# Patient Record
Sex: Male | Born: 1980
Health system: Southern US, Community
[De-identification: ages and names within clinical notes are randomized; demographics above are authoritative.]

## PROBLEM LIST (undated history)

## (undated) DIAGNOSIS — F419 Anxiety disorder, unspecified: Secondary | ICD-10-CM

## (undated) DIAGNOSIS — N35919 Unspecified urethral stricture, male, unspecified site: Secondary | ICD-10-CM

## (undated) DIAGNOSIS — J45909 Unspecified asthma, uncomplicated: Secondary | ICD-10-CM

## (undated) HISTORY — PX: URETHRAL DILATION: SUR417

---

## 2012-06-13 ENCOUNTER — Ambulatory Visit: Payer: Self-pay | Admitting: Family

## 2015-10-15 ENCOUNTER — Encounter: Payer: Self-pay | Admitting: Emergency Medicine

## 2015-10-15 ENCOUNTER — Observation Stay
Admission: EM | Admit: 2015-10-15 | Discharge: 2015-10-17 | Payer: No Typology Code available for payment source | Attending: Internal Medicine | Admitting: Internal Medicine

## 2015-10-15 DIAGNOSIS — F10929 Alcohol use, unspecified with intoxication, unspecified: Secondary | ICD-10-CM

## 2015-10-15 DIAGNOSIS — Z881 Allergy status to other antibiotic agents status: Secondary | ICD-10-CM | POA: Insufficient documentation

## 2015-10-15 DIAGNOSIS — R45851 Suicidal ideations: Secondary | ICD-10-CM | POA: Insufficient documentation

## 2015-10-15 DIAGNOSIS — F10129 Alcohol abuse with intoxication, unspecified: Secondary | ICD-10-CM | POA: Insufficient documentation

## 2015-10-15 DIAGNOSIS — F1910 Other psychoactive substance abuse, uncomplicated: Secondary | ICD-10-CM

## 2015-10-15 DIAGNOSIS — R4182 Altered mental status, unspecified: Secondary | ICD-10-CM | POA: Insufficient documentation

## 2015-10-15 DIAGNOSIS — G5600 Carpal tunnel syndrome, unspecified upper limb: Secondary | ICD-10-CM | POA: Insufficient documentation

## 2015-10-15 DIAGNOSIS — F329 Major depressive disorder, single episode, unspecified: Secondary | ICD-10-CM | POA: Insufficient documentation

## 2015-10-15 DIAGNOSIS — T510X2A Toxic effect of ethanol, intentional self-harm, initial encounter: Secondary | ICD-10-CM | POA: Insufficient documentation

## 2015-10-15 DIAGNOSIS — T50902A Poisoning by unspecified drugs, medicaments and biological substances, intentional self-harm, initial encounter: Secondary | ICD-10-CM

## 2015-10-15 DIAGNOSIS — T391X2A Poisoning by 4-Aminophenol derivatives, intentional self-harm, initial encounter: Principal | ICD-10-CM | POA: Insufficient documentation

## 2015-10-15 DIAGNOSIS — F17210 Nicotine dependence, cigarettes, uncomplicated: Secondary | ICD-10-CM | POA: Insufficient documentation

## 2015-10-15 DIAGNOSIS — IMO0002 Reserved for concepts with insufficient information to code with codable children: Secondary | ICD-10-CM

## 2015-10-15 LAB — SALICYLATE LEVEL

## 2015-10-15 LAB — COMPREHENSIVE METABOLIC PANEL
ALK PHOS: 60 U/L (ref 38–126)
ALT: 24 U/L (ref 17–63)
AST: 31 U/L (ref 15–41)
Albumin: 4.5 g/dL (ref 3.5–5.0)
Anion gap: 12 (ref 5–15)
BUN: 11 mg/dL (ref 6–20)
CALCIUM: 9 mg/dL (ref 8.9–10.3)
CO2: 24 mmol/L (ref 22–32)
CREATININE: 0.95 mg/dL (ref 0.61–1.24)
Chloride: 104 mmol/L (ref 101–111)
GFR calc non Af Amer: >60 mL/min (ref >60)
Glucose, Bld: 125 mg/dL — ABNORMAL HIGH (ref 65–99)
Potassium: 4.1 mmol/L (ref 3.5–5.1)
SODIUM: 140 mmol/L (ref 135–145)
Total Bilirubin: 0.9 mg/dL (ref 0.3–1.2)
Total Protein: 7.6 g/dL (ref 6.5–8.1)

## 2015-10-15 LAB — CBC
HEMATOCRIT: 42.7 % (ref 40.0–52.0)
HEMOGLOBIN: 15 g/dL (ref 13.0–18.0)
MCH: 36.1 pg — AB (ref 26.0–34.0)
MCHC: 35 g/dL (ref 32.0–36.0)
MCV: 103 fL — ABNORMAL HIGH (ref 80.0–100.0)
Platelets: 286 10*3/uL (ref 150–440)
RBC: 4.15 MIL/uL — ABNORMAL LOW (ref 4.40–5.90)
RDW: 12.9 % (ref 11.5–14.5)
WBC: 10.4 10*3/uL (ref 3.8–10.6)

## 2015-10-15 LAB — ETHANOL: Alcohol, Ethyl (B): 232 mg/dL — ABNORMAL HIGH (ref <5)

## 2015-10-15 LAB — ACETAMINOPHEN LEVEL: Acetaminophen (Tylenol), Serum: 69 ug/mL — ABNORMAL HIGH (ref 10–30)

## 2015-10-15 NOTE — ED Notes (Signed)
BEHAVIORAL HEALTH ROUNDING Patient sleeping: Yes.   Patient alert and oriented: yes Behavior appropriate: Yes.  ;  Nutrition and fluids offered: Yes  Toileting and hygiene offered: Yes  Sitter present: yes Law enforcement present: Yes   ENVIRONMENTAL ASSESSMENT Potentially harmful objects out of patient reach: Yes.   Personal belongings secured: Yes.   Patient dressed in hospital provided attire only: Yes.   Plastic bags out of patient reach: Yes.   Patient care equipment (cords, cables, call bells, lines, and drains) shortened, removed, or accounted for: Yes.   Equipment and supplies removed from bottom of stretcher: Yes.   Potentially toxic materials out of patient reach: Yes.   Sharps container removed or out of patient reach: Yes.     

## 2015-10-15 NOTE — ED Provider Notes (Signed)
[facility] Emergency Department Provider Note  ____________________________________________  Time seen: 2110  I have reviewed the triage vital signs and the nursing notes.  Limited history as the patient appears intoxicated and is not very communicative and cooperative.  HISTORY  Chief Complaint Suicidal     HPI Charles Meyers is a 34 y.o. male who is been brought to the emergency department under an involuntary commitment due to his erratic behavior and threats of self-harm. I am told he has drank quite a bit of NyQuil, alcohol, and has been making comments about wanting to put a gun to his head. It is unclear to me at this time and place became involved but the patient apparently ran from police. They needed to go into a wooded section to find him and under petition have brought him in handcuffs to the emergency department.  Upon arrival, I her the patient yelling and cursing, "give me the fuck out of here".  On my examination and interview, the patient appears subdued, somewhat somnolent, impaired, and not very conversant. He refuses parts of the exam. He points to the base of his skull and neck and says "put one right here". He makes other requests of me "put him down".    No past medical history on file. Patient provides no information for past medical history.  There are no active problems to display for this patient.   No past surgical history on file.  No current outpatient prescriptions on file.  Allergies Ciprofloxacin  History reviewed. No pertinent family history.  Social History Social History  Substance Use Topics  . Smoking status: Current Every Day Smoker    Types: Cigarettes  . Smokeless tobacco: None  . Alcohol Use: Yes    Review of Systems A reliable review of systems is not possible given the patient's lack of cooperation, communication, and impairment. He does report he "hurts all over". He does make comments in  reference to wanting to die.   ____________________________________________   PHYSICAL EXAM:  VITAL SIGNS: ED Triage Vitals  Enc Vitals Group     BP 10/15/15 2025 146/89 mmHg     Pulse Rate 10/15/15 2025 99     Resp 10/15/15 2028 18     Temp 10/15/15 2028 97.6 F (36.4 C)     Temp Source 10/15/15 2028 Oral     SpO2 10/15/15 2025 93 %     Weight 10/15/15 2028 190 lb (86.183 kg)     Height 10/15/15 2028 5\' 9"  (1.753 m)     Head Cir --      Peak Flow --      Pain Score --      Pain Loc --      Pain Edu? --      Excl. in GC? --     Constitutional:  On exam, the patient appears somewhat subdued. Initially he appeared to be sleeping. He awakens and speaks in a very soft voice. He does not give much details, he is not very interactive. He does appear to be impaired and troubled. ENT   Head: Normocephalic and atraumatic on gross exam  Cardiovascular: Normal rate at 76, regular rhythm - patient does not allow auscultation at this time.  Respiratory:  Patient appears to be breathing appropriately, possibly hypoventilating some, but no increased work of breathing. His oxygen saturation level is good. He refuses to allow auscultation of his lungs. Gastrointestinal: No distention on brief palpation. The patient grabs my hand and prevents  further examination and tells me not to touch him.. Musculoskeletal: No deformity noted. normal range of motion in all extremities.  No noted edema. Neurologic:  Patient appears impaired. He does move all 4 extremities spontaneously without any apparent limitation. Psychiatric: Patient appears somewhat subdued, somnolent, impaired. He does not give much information. He does tell me he wants me to "put him down". He says, "put one right here" as he points to the base of his skull. It is unclear what may have triggered this. Patient provides no further information. ____________________________________________    LABS (pertinent  positives/negatives)  Labs Reviewed  COMPREHENSIVE METABOLIC PANEL - Abnormal; Notable for the following:    Glucose, Bld 125 (*)    All other components within normal limits  ETHANOL - Abnormal; Notable for the following:    Alcohol, Ethyl (B) 232 (*)    All other components within normal limits  ACETAMINOPHEN LEVEL - Abnormal; Notable for the following:    Acetaminophen (Tylenol), Serum 69 (*)    All other components within normal limits  CBC - Abnormal; Notable for the following:    RBC 4.15 (*)    MCV 103.0 (*)    MCH 36.1 (*)    All other components within normal limits  SALICYLATE LEVEL  URINE DRUG SCREEN, QUALITATIVE ([facility] ONLY)     ____________________________________________  ____________________________________________   INITIAL IMPRESSION / ASSESSMENT AND PLAN / ED COURSE  Pertinent labs & imaging results that were available during my care of the patient were reviewed by me and considered in my medical decision making (see chart for details).   Impaired 34 year old male with multiple comments of wanting to be "put down". At this time, the patient needs further medical screening, the chance to sober up some, and then further psychiatric evaluation. We will continue the involuntary commitment.  ----------------------------------------- 10:56 PM on 10/15/2015 -----------------------------------------  Tylenol level is noted be 69. We will repeat this at 12 midnight and make a determination of the possible need for treatment.  Meanwhile, the patient continues to be a bit somnolent, but stable. I will transfer his care to Dr. Fanny Bien. ____________________________________________   FINAL CLINICAL IMPRESSION(S) / ED DIAGNOSES  Final diagnoses:  Intoxication  Alcohol intoxication, with unspecified complication (HCC)  Overdose, intentional self-harm, initial encounter [facility])      Darien Ramus, MD 10/15/15 2257
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
## 2015-10-15 NOTE — BHH Counselor (Signed)
TTS Counselor made attempt to speak with patient.  Pt was heavily intoxicated.  Will attempt when sober.

## 2015-10-16 LAB — URINE DRUG SCREEN, QUALITATIVE (ARMC ONLY)
Amphetamines, Ur Screen: NOT DETECTED
Barbiturates, Ur Screen: NOT DETECTED
Benzodiazepine, Ur Scrn: NOT DETECTED
Cannabinoid 50 Ng, Ur ~~LOC~~: NOT DETECTED
Cocaine Metabolite,Ur ~~LOC~~: NOT DETECTED
MDMA (Ecstasy)Ur Screen: NOT DETECTED
Methadone Scn, Ur: NOT DETECTED
Opiate, Ur Screen: NOT DETECTED
Phencyclidine (PCP) Ur S: NOT DETECTED
Tricyclic, Ur Screen: NOT DETECTED

## 2015-10-16 LAB — ACETAMINOPHEN LEVEL: Acetaminophen (Tylenol), Serum: 36 ug/mL — ABNORMAL HIGH (ref 10–30)

## 2015-10-16 LAB — TSH: TSH: 0.814 u[IU]/mL (ref 0.350–4.500)

## 2015-10-16 LAB — HEMOGLOBIN A1C: Hgb A1c MFr Bld: 5.1 % (ref 4.0–6.0)

## 2015-10-16 MED ORDER — SODIUM CHLORIDE 0.9 % IJ SOLN
3.0000 mL | Freq: Two times a day (BID) | INTRAMUSCULAR | Status: DC
  Administered 2015-10-16: 3 mL via INTRAVENOUS

## 2015-10-16 MED ORDER — HEPARIN SODIUM (PORCINE) 5000 UNIT/ML IJ SOLN
5000.0000 [IU] | Freq: Three times a day (TID) | INTRAMUSCULAR | Status: DC
  Filled 2015-10-16: qty 1

## 2015-10-16 MED ORDER — OXYCODONE HCL 5 MG PO TABS
5.0000 mg | ORAL_TABLET | ORAL | Status: DC | PRN
  Administered 2015-10-16 – 2015-10-17 (×3): 5 mg via ORAL
  Filled 2015-10-16 (×3): qty 1

## 2015-10-16 MED ORDER — LORAZEPAM 1 MG PO TABS
1.0000 mg | ORAL_TABLET | Freq: Four times a day (QID) | ORAL | Status: DC | PRN
  Administered 2015-10-17: 1 mg via ORAL

## 2015-10-16 MED ORDER — ONDANSETRON 4 MG PO TBDP: 4.0000 mg | ORAL_TABLET | Freq: Once | ORAL | Status: DC

## 2015-10-16 MED ORDER — ONDANSETRON HCL 4 MG PO TABS
4.0000 mg | ORAL_TABLET | Freq: Four times a day (QID) | ORAL | Status: DC | PRN
  Filled 2015-10-16: qty 1

## 2015-10-16 MED ORDER — LORAZEPAM 2 MG PO TABS
0.0000 mg | ORAL_TABLET | Freq: Four times a day (QID) | ORAL | Status: DC
  Administered 2015-10-16: 2 mg via ORAL
  Administered 2015-10-16 (×2): 4 mg via ORAL
  Administered 2015-10-17: 2 mg via ORAL
  Filled 2015-10-16 (×2): qty 2
  Filled 2015-10-16: qty 1
  Filled 2015-10-16: qty 2
  Filled 2015-10-16: qty 1

## 2015-10-16 MED ORDER — LORAZEPAM 2 MG PO TABS: 0.0000 mg | ORAL_TABLET | Freq: Two times a day (BID) | ORAL | Status: DC

## 2015-10-16 MED ORDER — LORAZEPAM 2 MG/ML IJ SOLN
1.0000 mg | Freq: Four times a day (QID) | INTRAMUSCULAR | Status: DC | PRN
  Administered 2015-10-17: 1 mg via INTRAVENOUS
  Filled 2015-10-16 (×2): qty 1

## 2015-10-16 MED ORDER — DEXTROSE 5 % IV SOLN
15.0000 mg/kg/h | INTRAVENOUS | Status: DC
  Administered 2015-10-16: 15 mg/kg/h via INTRAVENOUS
  Filled 2015-10-16: qty 200

## 2015-10-16 MED ORDER — ONDANSETRON HCL 4 MG/2ML IJ SOLN
4.0000 mg | Freq: Four times a day (QID) | INTRAMUSCULAR | Status: DC | PRN
Start: 2015-10-16 — End: 2015-10-17
  Administered 2015-10-16: 4 mg via INTRAVENOUS
  Filled 2015-10-16 (×2): qty 2

## 2015-10-16 MED ORDER — ACETYLCYSTEINE LOAD VIA INFUSION
150.0000 mg/kg | Freq: Once | INTRAVENOUS | Status: AC
  Administered 2015-10-16: 12930 mg via INTRAVENOUS
  Filled 2015-10-16: qty 324

## 2015-10-16 MED ORDER — MORPHINE SULFATE (PF) 2 MG/ML IV SOLN
2.0000 mg | INTRAVENOUS | Status: DC | PRN
  Administered 2015-10-16 – 2015-10-17 (×6): 2 mg via INTRAVENOUS
  Filled 2015-10-16 (×6): qty 1

## 2015-10-16 MED ORDER — FOLIC ACID 1 MG PO TABS
1.0000 mg | ORAL_TABLET | Freq: Every day | ORAL | Status: DC
  Administered 2015-10-16 – 2015-10-17 (×2): 1 mg via ORAL
  Filled 2015-10-16 (×2): qty 1

## 2015-10-16 MED ORDER — NICOTINE 10 MG IN INHA
1.0000 | RESPIRATORY_TRACT | Status: DC | PRN
  Administered 2015-10-16 – 2015-10-17 (×2): 1 via RESPIRATORY_TRACT
  Filled 2015-10-16: qty 36

## 2015-10-16 MED ORDER — ACETYLCYSTEINE 20 % IN SOLN
600.0000 mg | Freq: Two times a day (BID) | RESPIRATORY_TRACT | Status: DC
  Filled 2015-10-16: qty 4

## 2015-10-16 MED ORDER — THIAMINE HCL 100 MG/ML IJ SOLN: 100.0000 mg | Freq: Every day | INTRAMUSCULAR | Status: DC

## 2015-10-16 MED ORDER — SODIUM CHLORIDE 0.9 % IV SOLN
INTRAVENOUS | Status: DC
  Administered 2015-10-16 – 2015-10-17 (×4): via INTRAVENOUS

## 2015-10-16 MED ORDER — VITAMIN B-1 100 MG PO TABS
100.0000 mg | ORAL_TABLET | Freq: Every day | ORAL | Status: DC
  Administered 2015-10-16 – 2015-10-17 (×2): 100 mg via ORAL
  Filled 2015-10-16 (×2): qty 1

## 2015-10-16 MED ORDER — ACETYLCYSTEINE 20 % IN SOLN
140.0000 mg/kg | Freq: Once | RESPIRATORY_TRACT | Status: DC
  Filled 2015-10-16: qty 90

## 2015-10-16 MED ORDER — ADULT MULTIVITAMIN W/MINERALS CH
1.0000 | ORAL_TABLET | Freq: Every day | ORAL | Status: DC
  Administered 2015-10-16 – 2015-10-17 (×2): 1 via ORAL
  Filled 2015-10-16 (×2): qty 1

## 2015-10-16 MED ORDER — ENOXAPARIN SODIUM 40 MG/0.4ML ~~LOC~~ SOLN
40.0000 mg | SUBCUTANEOUS | Status: DC
  Administered 2015-10-16 – 2015-10-17 (×2): 40 mg via SUBCUTANEOUS
  Filled 2015-10-16 (×2): qty 0.4

## 2015-10-16 MED ORDER — POLYVINYL ALCOHOL 1.4 % OP SOLN
1.0000 [drp] | OPHTHALMIC | Status: DC | PRN
  Administered 2015-10-17: 1 [drp] via OPHTHALMIC
  Filled 2015-10-16: qty 15

## 2015-10-16 MED ORDER — ACETYLCYSTEINE 20 % IN SOLN
70.0000 mg/kg | RESPIRATORY_TRACT | Status: DC
  Filled 2015-10-16 (×7): qty 40

## 2015-10-16 NOTE — Progress Notes (Signed)
[facility] [place] [facility]   PATIENT NAME: Charles EmmerJoseph Meyers    MR#:  [id number]  DATE OF BIRTH:  March 07, 1981  SUBJECTIVE:  Patient just fell asleep. According to the girlfriend is at bedside patient was up early this morning and a breakfast.  REVIEW OF SYSTEMS:    Review of Systems  Unable to perform ROS  sleeping Tolerating Diet: Yes      DRUG ALLERGIES:   Allergies  Allergen Reactions  . Ciprofloxacin     VITALS:  Blood pressure 126/76, pulse 73, temperature 98.4 F (36.9 C), temperature source Oral, resp. rate 16, height 5\' 9"  (1.753 m), weight 70.308 kg (155 lb), SpO2 98 %.  PHYSICAL EXAMINATION:   Physical Exam  Constitutional: He is well-developed, well-nourished, and in no distress. No distress.  HENT:  Head: Normocephalic.  Eyes: No scleral icterus.  Neck: No JVD present. No tracheal deviation present.  Cardiovascular: Normal rate, regular rhythm and normal heart sounds.  Exam reveals no gallop and no friction rub.   No murmur heard. Pulmonary/Chest: Effort normal and breath sounds normal. No respiratory distress. He has no wheezes. He has no rales. He exhibits no tenderness.  Abdominal: Soft. Bowel sounds are normal. He exhibits no distension and no mass. There is no tenderness. There is no rebound and no guarding.  Musculoskeletal: Normal range of motion. He exhibits no edema.  Neurological:  sleeping  Skin: Skin is warm. No rash noted. No erythema.      LABORATORY PANEL:   CBC  Recent Labs Lab 10/15/15 2035  WBC 10.4  HGB 15.0  HCT 42.7  PLT 286   ------------------------------------------------------------------------------------------------------------------  Chemistries   Recent Labs Lab 10/15/15 2035  NA 140  K 4.1  CL 104  CO2 24  GLUCOSE 125*  BUN 11  CREATININE 0.95  CALCIUM 9.0  AST 31  ALT 24  ALKPHOS 60  BILITOT 0.9    ------------------------------------------------------------------------------------------------------------------  Cardiac Enzymes No results for input(s): TROPONINI in the last 168 hours. ------------------------------------------------------------------------------------------------------------------  RADIOLOGY:  No results found.   ASSESSMENT AND PLAN:   34 year old male with a history of depression who presented after intentional overdose with EtOh and NyQuil.  1. Intentional overdose with EtOH and NyQuil: Patient will need psychiatric evaluation once patient is more awake. Corporate investment bankerContinue sitter.  2. Tylenol overdose: This was in NyQuil. Tylenol level is improving. LFTs are within normal limits. Continue NAC for another 12 hours. Repeat Tylenol and LFTs as per poison control consultation.  3. Depression: Patient will likely need psychiatric inpatient hospitalization after medical clearance.    4. EtOH abuse: Patient is on CIWA protocol    Management plans discussed with the patient's girlfriend and she is in agreement.  CODE STATUS: FULL  TOTAL TIME TAKING CARE OF THIS PATIENT: 30 minutes.     POSSIBLE D/C 1-2 days, DEPENDING ON CLINICAL CONDITION.   Aubery Douthat M.D on 10/16/2015 at 12:11 PM  Between 7am to 6pm - Pager - [phone] After 6pm go to [email or web address] - password [email or web address] [facility]  [facility]  Office  [phone]  CC: Primary care physician; No primary care provider on file.  Note: This dictation was prepared with Dragon dictation along with smaller phrase technology. Any transcriptional errors that result from this process are unintentional.

## 2015-10-16 NOTE — Progress Notes (Addendum)
Acetylcysteine consult  Pharmacy consulted to dose N-acetylcysteine for acetaminophen toxicity. Per Poison Control recommendation starting 150 mg/kg IV load followed by 15 mg/kg/hr infusion. Acetaminophen level, AST and ALT ordered for 01:00, 11/4.  Fulton ReekMatt Derik Fults, PharmD, BCPS  10/16/2015

## 2015-10-16 NOTE — H&P (Signed)
Charles Meyers is an 34 y.o. male.   Chief Complaint: Intoxication HPI: The patient presents emergency department in the custody of police following O Seidel statements he made to his girlfriend. Patient admits to drinking alcohol and NyQuil and stating that "he does not deserve to live" after a dispute with his girlfriend. In the emergency department the patient voices no complaints. He denies pain but will not contribute to his medical history. He told the emergency department physician he could not remember the time of NyQuil ingestion. Laboratory evaluation in the emergency department revealed elevated Tylenol level. Poison control recommended 12 hours of N-acetylcysteine therapy due to unknown time of ingestion of acetaminophen.  No past medical history on file. patient is uncooperative  No past surgical history on file. patient is uncooperative  History reviewed. No pertinent family history. patient is uncooperative Social History:  reports that he has been smoking Cigarettes.  He does not have any smokeless tobacco history on file. He reports that he drinks alcohol. He reports that he does not use illicit drugs.  Allergies:  Allergies  Allergen Reactions  . Ciprofloxacin      (Not in a hospital admission)  None  Results for orders placed or performed during the hospital encounter of 10/15/15 (from the past 48 hour(s))  Comprehensive metabolic panel     Status: Abnormal   Collection Time: 10/15/15  8:35 PM  Result Value Ref Range   Sodium 140 135 - 145 mmol/L   Potassium 4.1 3.5 - 5.1 mmol/L    Comment: HEMOLYSIS AT THIS LEVEL MAY AFFECT RESULT   Chloride 104 101 - 111 mmol/L   CO2 24 22 - 32 mmol/L   Glucose, Bld 125 (H) 65 - 99 mg/dL   BUN 11 6 - 20 mg/dL   Creatinine, Ser 0.95 0.61 - 1.24 mg/dL   Calcium 9.0 8.9 - 10.3 mg/dL   Total Protein 7.6 6.5 - 8.1 g/dL   Albumin 4.5 3.5 - 5.0 g/dL   AST 31 15 - 41 U/L   ALT 24 17 - 63 U/L   Alkaline Phosphatase 60 38 - 126 U/L    Total Bilirubin 0.9 0.3 - 1.2 mg/dL   GFR calc non Af Amer >60 >60 mL/min   GFR calc Af Amer >60 >60 mL/min    Comment: (NOTE) The eGFR has been calculated using the CKD EPI equation. This calculation has not been validated in all clinical situations. eGFR's persistently <60 mL/min signify possible Chronic Kidney Disease.    Anion gap 12 5 - 15  Ethanol (ETOH)     Status: Abnormal   Collection Time: 10/15/15  8:35 PM  Result Value Ref Range   Alcohol, Ethyl (B) 232 (H) <5 mg/dL    Comment:        LOWEST DETECTABLE LIMIT FOR SERUM ALCOHOL IS 5 mg/dL FOR MEDICAL PURPOSES ONLY   Salicylate level     Status: None   Collection Time: 10/15/15  8:35 PM  Result Value Ref Range   Salicylate Lvl <0.2 2.8 - 30.0 mg/dL  Acetaminophen level     Status: Abnormal   Collection Time: 10/15/15  8:35 PM  Result Value Ref Range   Acetaminophen (Tylenol), Serum 69 (H) 10 - 30 ug/mL    Comment:        THERAPEUTIC CONCENTRATIONS VARY SIGNIFICANTLY. A RANGE OF 10-30 ug/mL MAY BE AN EFFECTIVE CONCENTRATION FOR MANY PATIENTS. HOWEVER, SOME ARE BEST TREATED AT CONCENTRATIONS OUTSIDE THIS RANGE. ACETAMINOPHEN CONCENTRATIONS >150 ug/mL AT 4  HOURS AFTER INGESTION AND >50 ug/mL AT 12 HOURS AFTER INGESTION ARE OFTEN ASSOCIATED WITH TOXIC REACTIONS.   CBC     Status: Abnormal   Collection Time: 10/15/15  8:35 PM  Result Value Ref Range   WBC 10.4 3.8 - 10.6 K/uL   RBC 4.15 (L) 4.40 - 5.90 MIL/uL   Hemoglobin 15.0 13.0 - 18.0 g/dL   HCT 42.7 40.0 - 52.0 %   MCV 103.0 (H) 80.0 - 100.0 fL   MCH 36.1 (H) 26.0 - 34.0 pg   MCHC 35.0 32.0 - 36.0 g/dL   RDW 12.9 11.5 - 14.5 %   Platelets 286 150 - 440 K/uL  Acetaminophen level     Status: Abnormal   Collection Time: 10/15/15 11:10 PM  Result Value Ref Range   Acetaminophen (Tylenol), Serum 36 (H) 10 - 30 ug/mL    Comment:        THERAPEUTIC CONCENTRATIONS VARY SIGNIFICANTLY. A RANGE OF 10-30 ug/mL MAY BE AN EFFECTIVE CONCENTRATION FOR MANY  PATIENTS. HOWEVER, SOME ARE BEST TREATED AT CONCENTRATIONS OUTSIDE THIS RANGE. ACETAMINOPHEN CONCENTRATIONS >150 ug/mL AT 4 HOURS AFTER INGESTION AND >50 ug/mL AT 12 HOURS AFTER INGESTION ARE OFTEN ASSOCIATED WITH TOXIC REACTIONS.    No results found.  Review of Systems  Unable to perform ROS: patient intoxicated    Blood pressure 123/88, pulse 95, temperature 97.6 F (36.4 C), temperature source Oral, resp. rate 21, height $RemoveBe'5\' 9"'[id number]$  (1.753 m), weight 86.183 kg (190 lb), SpO2 96 %. Physical Exam  Nursing note and vitals reviewed. Constitutional: He appears well-developed and well-nourished. No distress.  HENT:  Head: Normocephalic and atraumatic.  Mouth/Throat: Oropharynx is clear and moist.  Eyes: Conjunctivae and EOM are normal. Pupils are equal, round, and reactive to light. No scleral icterus.  Neck: Normal range of motion. Neck supple. No JVD present. No tracheal deviation present. No thyromegaly present.  Cardiovascular: Normal rate, regular rhythm and normal heart sounds.  Exam reveals no gallop and no friction rub.   No murmur heard. Respiratory: Effort normal and breath sounds normal.  GI: Soft. Bowel sounds are normal. He exhibits no distension. There is no tenderness.  Genitourinary:  Deferred   Lymphadenopathy:    He has no cervical adenopathy.  Neurological: He is alert. No cranial nerve deficit.  Not clear if patient is oriented as he will not speak to examiner  Skin: Skin is warm and dry. Rash (facial acne) noted. No erythema.  Psychiatric:  Mood depressed; affect flat; behavior apparently suicidal; thought content difficult to assess patient will not cooperate with examiner; judgement apparently poor     Assessment/Plan This is a 34 year old Caucasian male admitted for elevated Tylenol level. He is involuntarily committed for suicidal ideation. 1. Tylenol toxicity: Unknown time of ingestion poison control recommends 12 hours of N-acetylcysteine therapy. Liver  enzymes are normal at this time. 2. Suicidal ideation: The patient will be placed with a one-to-one sitter 3. Alcohol abuse: Intravenous fluid for alcohol intoxication. CIWA screening 4. DVT prophylaxis: Upper and 5. GI prophylaxis: None The patient is a full code. Time spent on admission orders and patient care possibly 35 minutes  Harrie Foreman 10/16/2015, 2:01 AM

## 2015-10-16 NOTE — ED Provider Notes (Signed)
-----------------------------------------   1:29 AM on 10/16/2015 -----------------------------------------   Blood pressure 123/88, pulse 95, temperature 97.6 F (36.4 C), temperature source Oral, resp. rate 21, height 5\' 9"  (1.753 m), weight 190 lb (86.183 kg), SpO2 96 %.  The patient had no acute events since last update.  Calm and cooperative at this time.  Disposition is pending per Psychiatry/Behavioral Medicine team recommendations.     I called Poison Control to discuss (re: elevated APAP, now down trending) and unknown time of clear ingestion. At this point, Poison Control advises treatment with NAC.   Given NAC recommendation, will initiate NAC. After 12 hours of NAC, if APAP and LFTS then can discontinue and clear medically.  Sharyn CreamerMark Valisa Karpel, MD 10/16/15 [phone]

## 2015-10-17 ENCOUNTER — Inpatient Hospital Stay
Admission: EM | Admit: 2015-10-17 | Discharge: 2015-10-20 | DRG: 897 | Disposition: A | Discharge status: Home / Self Care | Payer: No Typology Code available for payment source | Source: Intra-hospital | Attending: Psychiatry | Admitting: Psychiatry

## 2015-10-17 DIAGNOSIS — F17210 Nicotine dependence, cigarettes, uncomplicated: Secondary | ICD-10-CM | POA: Diagnosis present

## 2015-10-17 DIAGNOSIS — F1020 Alcohol dependence, uncomplicated: Secondary | ICD-10-CM

## 2015-10-17 DIAGNOSIS — F332 Major depressive disorder, recurrent severe without psychotic features: Secondary | ICD-10-CM | POA: Diagnosis present

## 2015-10-17 DIAGNOSIS — F10939 Alcohol use, unspecified with withdrawal, unspecified: Secondary | ICD-10-CM

## 2015-10-17 DIAGNOSIS — R45851 Suicidal ideations: Secondary | ICD-10-CM | POA: Diagnosis present

## 2015-10-17 DIAGNOSIS — Z818 Family history of other mental and behavioral disorders: Secondary | ICD-10-CM | POA: Diagnosis not present

## 2015-10-17 DIAGNOSIS — F909 Attention-deficit hyperactivity disorder, unspecified type: Secondary | ICD-10-CM | POA: Diagnosis present

## 2015-10-17 DIAGNOSIS — F1024 Alcohol dependence with alcohol-induced mood disorder: Secondary | ICD-10-CM | POA: Diagnosis not present

## 2015-10-17 DIAGNOSIS — F10239 Alcohol dependence with withdrawal, unspecified: Secondary | ICD-10-CM | POA: Diagnosis present

## 2015-10-17 DIAGNOSIS — Z7289 Other problems related to lifestyle: Secondary | ICD-10-CM

## 2015-10-17 DIAGNOSIS — G4700 Insomnia, unspecified: Secondary | ICD-10-CM | POA: Diagnosis present

## 2015-10-17 DIAGNOSIS — F322 Major depressive disorder, single episode, severe without psychotic features: Secondary | ICD-10-CM

## 2015-10-17 DIAGNOSIS — Z888 Allergy status to other drugs, medicaments and biological substances status: Secondary | ICD-10-CM | POA: Diagnosis not present

## 2015-10-17 DIAGNOSIS — F3289 Other specified depressive episodes: Secondary | ICD-10-CM

## 2015-10-17 DIAGNOSIS — F10229 Alcohol dependence with intoxication, unspecified: Secondary | ICD-10-CM | POA: Diagnosis not present

## 2015-10-17 DIAGNOSIS — F419 Anxiety disorder, unspecified: Secondary | ICD-10-CM | POA: Diagnosis present

## 2015-10-17 DIAGNOSIS — F17200 Nicotine dependence, unspecified, uncomplicated: Secondary | ICD-10-CM

## 2015-10-17 LAB — ACETAMINOPHEN LEVEL: Acetaminophen (Tylenol), Serum: <10 ug/mL — ABNORMAL LOW (ref 10–30)

## 2015-10-17 LAB — ALT: ALT: 17 U/L (ref 17–63)

## 2015-10-17 LAB — AST: AST: 18 U/L (ref 15–41)

## 2015-10-17 MED ORDER — THIAMINE HCL 100 MG/ML IJ SOLN: 100.0000 mg | Freq: Every day | INTRAMUSCULAR | Status: DC

## 2015-10-17 MED ORDER — ALUM & MAG HYDROXIDE-SIMETH 200-200-20 MG/5ML PO SUSP: 30.0000 mL | ORAL | Status: DC | PRN

## 2015-10-17 MED ORDER — FOLIC ACID 1 MG PO TABS
1.0000 mg | ORAL_TABLET | Freq: Every day | ORAL | Status: DC
  Administered 2015-10-17 – 2015-10-20 (×4): 1 mg via ORAL
  Filled 2015-10-17 (×4): qty 1

## 2015-10-17 MED ORDER — ADULT MULTIVITAMIN W/MINERALS CH
1.0000 | ORAL_TABLET | Freq: Every day | ORAL | Status: DC
  Administered 2015-10-17 – 2015-10-20 (×4): 1 via ORAL
  Filled 2015-10-17 (×4): qty 1

## 2015-10-17 MED ORDER — TRAZODONE HCL 100 MG PO TABS
100.0000 mg | ORAL_TABLET | Freq: Once | ORAL | Status: AC
Start: 2015-10-17 — End: 2015-10-17
  Administered 2015-10-17: 100 mg via ORAL
  Filled 2015-10-17: qty 1

## 2015-10-17 MED ORDER — OXYCODONE HCL 5 MG PO TABS
5.0000 mg | ORAL_TABLET | Freq: Once | ORAL | Status: AC
  Administered 2015-10-17: 5 mg via ORAL
  Filled 2015-10-17: qty 1

## 2015-10-17 MED ORDER — MAGNESIUM HYDROXIDE 400 MG/5ML PO SUSP
30.0000 mL | Freq: Every day | ORAL | Status: DC | PRN
Start: 2015-10-17 — End: 2015-10-20

## 2015-10-17 MED ORDER — PNEUMOCOCCAL VAC POLYVALENT 25 MCG/0.5ML IJ INJ: 0.5000 mL | INJECTION | INTRAMUSCULAR | Status: DC

## 2015-10-17 MED ORDER — LORAZEPAM 1 MG PO TABS
1.0000 mg | ORAL_TABLET | Freq: Four times a day (QID) | ORAL | Status: DC | PRN
  Administered 2015-10-18 – 2015-10-19 (×2): 1 mg via ORAL
  Filled 2015-10-17 (×2): qty 1

## 2015-10-17 MED ORDER — CITALOPRAM HYDROBROMIDE 20 MG PO TABS
20.0000 mg | ORAL_TABLET | Freq: Every day | ORAL | Status: DC
  Administered 2015-10-17: 20 mg via ORAL
  Filled 2015-10-17: qty 1

## 2015-10-17 MED ORDER — LORAZEPAM 2 MG/ML IJ SOLN: 1.0000 mg | Freq: Four times a day (QID) | INTRAMUSCULAR | Status: DC | PRN

## 2015-10-17 MED ORDER — LORAZEPAM 2 MG PO TABS
0.0000 mg | ORAL_TABLET | Freq: Four times a day (QID) | ORAL | Status: AC
  Administered 2015-10-17 – 2015-10-19 (×4): 2 mg via ORAL
  Filled 2015-10-17: qty 1
  Filled 2015-10-17: qty 2
  Filled 2015-10-17 (×2): qty 1

## 2015-10-17 MED ORDER — LORAZEPAM 2 MG PO TABS
0.0000 mg | ORAL_TABLET | Freq: Two times a day (BID) | ORAL | Status: DC
Start: 2015-10-19 — End: 2015-10-20
  Administered 2015-10-20: 4 mg via ORAL
  Filled 2015-10-17 (×2): qty 1

## 2015-10-17 MED ORDER — INFLUENZA VAC SPLIT QUAD 0.5 ML IM SUSY
0.5000 mL | PREFILLED_SYRINGE | INTRAMUSCULAR | Status: AC
  Administered 2015-10-20: 0.5 mL via INTRAMUSCULAR
  Filled 2015-10-17: qty 0.5

## 2015-10-17 MED ORDER — VITAMIN B-1 100 MG PO TABS
100.0000 mg | ORAL_TABLET | Freq: Every day | ORAL | Status: DC
  Administered 2015-10-18 – 2015-10-20 (×3): 100 mg via ORAL
  Filled 2015-10-17 (×4): qty 1
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
## 2015-10-17 NOTE — Consult Note (Signed)
[facility] Face-to-Face Psychiatry Consult   Reason for Consult:  Consult for this 34 year old man brought to the hospital after being found having taken an overdose at home. Referring Physician:  Mody Patient Identification: Charles Meyers MRN:  [id number] Principal Diagnosis: Major depression [facility]) Diagnosis:   Patient Active Problem List   Diagnosis Date Noted  . Suicidal behavior [F48.9] 10/17/2015  . Major depression ([place]) [F32.9] 10/17/2015  . Alcohol abuse [F10.10] 10/17/2015  . Tylenol toxicity [T39.1X1A] 10/16/2015    Total Time spent with patient: 1 hour  Subjective:   Charles Meyers is a 34 y.o. male patient admitted with "I just had a cold".  HPI:  Information from the patient and the chart. Patient interviewed. Also interviewed his girlfriend who was present at the time. Also reviewed the chart including reviewing labs and spoke with the hospitalist. This 34 year old man was brought into the hospital early yesterday morning after his girlfriend called 911. She says that she found him at home with an altered mental state not really responding. She got concerned and then he text at her a message that had a ominous tone sounding like it intended that he was suicidal. When she called 911 the patient apparently became agitated and tried to run away. Patient admits that he drank probably 2 bottles of NyQuil. Also had been drinking alcohol. Says he drank about 2 40 ounce bottles of beer. Denies that he took any other drugs. Patient says that he feels bad and upset when he can't work. He is evasive about answering more specific questions about suicidality. Does say that he hadn't been sleeping well. He is evasive also about the total amount that he drinks and how often. Patient is not currently seeing any one for outpatient psychiatric treatment. He reports multiple chronic pain issues but no other medical problems.  Social history: Patient lives with his girlfriend. He is not from [place] and the 2 of them only lived here for about a year. They've been in [facility] for just about 1-2 months. He is not getting any outpatient treatment here. He has been out of work for over a week and that seems to distress him a great deal.  Medical history: Patient describes multiple orthopedic and medical-like injuries including saying he has carpal tunnel syndrome and has a lot of pain. No other identified medical problems. It does appear that he smokes.  Substance abuse history: He has been to a rehabilitation on one prior occasion but says he's never stop drinking. Doesn't seem to have much interest in it. He is evasive about how much or problem it is. He denies that he abuses other drugs.  Past Psychiatric History: Patient was also evasive about this but eventually revealed that he had had at least 1 previous psychiatric hospitalization although it sounds like it may have been mostly for substance abuse. He has not been compliant with medication although he says he's been prescribed several previous medications including lithium. Patient denies history of violence . Patient indicates that he has had suicide attempts in the past but will not give any further information  Risk to Self: Is patient at risk for suicide?: Yes Risk to Others:   Prior Inpatient Therapy:   Prior Outpatient Therapy:    Past Medical History: No past medical history on file. No past surgical history on file. Family History: History reviewed. No pertinent family history. Family Psychiatric  History: Patient reports that his father had an alcohol problem but does not report any  other mental health history in his family that he knows of. Social History:  History  Alcohol Use  . Yes     History  Drug Use No    Social History   Social History  . Marital Status: Unknown    Spouse Name: N/A  . Number of Children: N/A  . Years of Education: N/A   Social History Main Topics  . Smoking status: Current  Every Day Smoker    Types: Cigarettes  . Smokeless tobacco: None  . Alcohol Use: Yes  . Drug Use: No  . Sexual Activity: Not Asked   Other Topics Concern  . None   Social History Narrative   Additional Social History:                          Allergies:   Allergies  Allergen Reactions  . Ciprofloxacin     Labs:  Results for orders placed or performed during the hospital encounter of 10/15/15 (from the past 48 hour(s))  Comprehensive metabolic panel     Status: Abnormal   Collection Time: 10/15/15  8:35 PM  Result Value Ref Range   Sodium 140 135 - 145 mmol/L   Potassium 4.1 3.5 - 5.1 mmol/L    Comment: HEMOLYSIS AT THIS LEVEL MAY AFFECT RESULT   Chloride 104 101 - 111 mmol/L   CO2 24 22 - 32 mmol/L   Glucose, Bld 125 (H) 65 - 99 mg/dL   BUN 11 6 - 20 mg/dL   Creatinine, Ser 0.95 0.61 - 1.24 mg/dL   Calcium 9.0 8.9 - 10.3 mg/dL   Total Protein 7.6 6.5 - 8.1 g/dL   Albumin 4.5 3.5 - 5.0 g/dL   AST 31 15 - 41 U/L   ALT 24 17 - 63 U/L   Alkaline Phosphatase 60 38 - 126 U/L   Total Bilirubin 0.9 0.3 - 1.2 mg/dL   GFR calc non Af Amer >60 >60 mL/min   GFR calc Af Amer >60 >60 mL/min    Comment: (NOTE) The eGFR has been calculated using the CKD EPI equation. This calculation has not been validated in all clinical situations. eGFR's persistently <60 mL/min signify possible Chronic Kidney Disease.    Anion gap 12 5 - 15  Ethanol (ETOH)     Status: Abnormal   Collection Time: 10/15/15  8:35 PM  Result Value Ref Range   Alcohol, Ethyl (B) 232 (H) <5 mg/dL    Comment:        LOWEST DETECTABLE LIMIT FOR SERUM ALCOHOL IS 5 mg/dL FOR MEDICAL PURPOSES ONLY   Salicylate level     Status: None   Collection Time: 10/15/15  8:35 PM  Result Value Ref Range   Salicylate Lvl <0.1 2.8 - 30.0 mg/dL  Acetaminophen level     Status: Abnormal   Collection Time: 10/15/15  8:35 PM  Result Value Ref Range   Acetaminophen (Tylenol), Serum 69 (H) 10 - 30 ug/mL    Comment:         THERAPEUTIC CONCENTRATIONS VARY SIGNIFICANTLY. A RANGE OF 10-30 ug/mL MAY BE AN EFFECTIVE CONCENTRATION FOR MANY PATIENTS. HOWEVER, SOME ARE BEST TREATED AT CONCENTRATIONS OUTSIDE THIS RANGE. ACETAMINOPHEN CONCENTRATIONS >150 ug/mL AT 4 HOURS AFTER INGESTION AND >50 ug/mL AT 12 HOURS AFTER INGESTION ARE OFTEN ASSOCIATED WITH TOXIC REACTIONS.   CBC     Status: Abnormal   Collection Time: 10/15/15  8:35 PM  Result Value Ref Range   WBC  10.4 3.8 - 10.6 K/uL   RBC 4.15 (L) 4.40 - 5.90 MIL/uL   Hemoglobin 15.0 13.0 - 18.0 g/dL   HCT 42.7 40.0 - 52.0 %   MCV 103.0 (H) 80.0 - 100.0 fL   MCH 36.1 (H) 26.0 - 34.0 pg   MCHC 35.0 32.0 - 36.0 g/dL   RDW 12.9 11.5 - 14.5 %   Platelets 286 150 - 440 K/uL  TSH     Status: None   Collection Time: 10/15/15  8:35 PM  Result Value Ref Range   TSH 0.814 0.350 - 4.500 uIU/mL  Hemoglobin A1c     Status: None   Collection Time: 10/15/15  8:35 PM  Result Value Ref Range   Hgb A1c MFr Bld 5.1 4.0 - 6.0 %  Acetaminophen level     Status: Abnormal   Collection Time: 10/15/15 11:10 PM  Result Value Ref Range   Acetaminophen (Tylenol), Serum 36 (H) 10 - 30 ug/mL    Comment:        THERAPEUTIC CONCENTRATIONS VARY SIGNIFICANTLY. A RANGE OF 10-30 ug/mL MAY BE AN EFFECTIVE CONCENTRATION FOR MANY PATIENTS. HOWEVER, SOME ARE BEST TREATED AT CONCENTRATIONS OUTSIDE THIS RANGE. ACETAMINOPHEN CONCENTRATIONS >150 ug/mL AT 4 HOURS AFTER INGESTION AND >50 ug/mL AT 12 HOURS AFTER INGESTION ARE OFTEN ASSOCIATED WITH TOXIC REACTIONS.   Urine Drug Screen, Qualitative ([facility] only)     Status: None   Collection Time: 10/16/15  8:38 AM  Result Value Ref Range   Tricyclic, Ur Screen NONE DETECTED NONE DETECTED   Amphetamines, Ur Screen NONE DETECTED NONE DETECTED   MDMA (Ecstasy)Ur Screen NONE DETECTED NONE DETECTED   Cocaine Metabolite,Ur [place] NONE DETECTED NONE DETECTED   Opiate, Ur Screen NONE DETECTED NONE DETECTED   Phencyclidine (PCP) Ur S NONE  DETECTED NONE DETECTED   Cannabinoid 50 Ng, Ur [place] NONE DETECTED NONE DETECTED   Barbiturates, Ur Screen NONE DETECTED NONE DETECTED   Benzodiazepine, Ur Scrn NONE DETECTED NONE DETECTED   Methadone Scn, Ur NONE DETECTED NONE DETECTED    Comment: (NOTE) 295  Tricyclics, urine               Cutoff 1000 ng/mL 200  Amphetamines, urine             Cutoff 1000 ng/mL 300  MDMA (Ecstasy), urine           Cutoff 500 ng/mL 400  Cocaine Metabolite, urine       Cutoff 300 ng/mL 500  Opiate, urine                   Cutoff 300 ng/mL 600  Phencyclidine (PCP), urine      Cutoff 25 ng/mL 700  Cannabinoid, urine              Cutoff 50 ng/mL 800  Barbiturates, urine             Cutoff 200 ng/mL 900  Benzodiazepine, urine           Cutoff 200 ng/mL 1000 Methadone, urine                Cutoff 300 ng/mL 1100 1200 The urine drug screen provides only a preliminary, unconfirmed 1300 analytical test result and should not be used for non-medical 1400 purposes. Clinical consideration and professional judgment should 1500 be applied to any positive drug screen result due to possible 1600 interfering substances. A more specific alternate chemical method 1700 must be used in order to obtain a  confirmed analytical result.  1800 Gas chromato graphy / mass spectrometry (GC/MS) is the preferred 1900 confirmatory method.   AST     Status: None   Collection Time: 10/17/15  1:14 AM  Result Value Ref Range   AST 18 15 - 41 U/L  ALT     Status: None   Collection Time: 10/17/15  1:14 AM  Result Value Ref Range   ALT 17 17 - 63 U/L  Acetaminophen level     Status: Abnormal   Collection Time: 10/17/15  1:14 AM  Result Value Ref Range   Acetaminophen (Tylenol), Serum <10 (L) 10 - 30 ug/mL    Comment:        THERAPEUTIC CONCENTRATIONS VARY SIGNIFICANTLY. A RANGE OF 10-30 ug/mL MAY BE AN EFFECTIVE CONCENTRATION FOR MANY PATIENTS. HOWEVER, SOME ARE BEST TREATED AT CONCENTRATIONS OUTSIDE THIS RANGE. ACETAMINOPHEN  CONCENTRATIONS >150 ug/mL AT 4 HOURS AFTER INGESTION AND >50 ug/mL AT 12 HOURS AFTER INGESTION ARE OFTEN ASSOCIATED WITH TOXIC REACTIONS.     Current Facility-Administered Medications  Medication Dose Route Frequency Provider Last Rate Last Dose  . 0.9 %  sodium chloride infusion   Intravenous Continuous Harrie Foreman, MD 125 mL/hr at 10/17/15 1408    . enoxaparin (LOVENOX) injection 40 mg  40 mg Subcutaneous Q24H Bettey Costa, MD   40 mg at 10/17/15 1205  . folic acid (FOLVITE) tablet 1 mg  1 mg Oral Daily Bettey Costa, MD   1 mg at 10/17/15 0924  . LORazepam (ATIVAN) tablet 1 mg  1 mg Oral Q6H PRN Bettey Costa, MD   1 mg at 10/17/15 0602   Or  . LORazepam (ATIVAN) injection 1 mg  1 mg Intravenous Q6H PRN Bettey Costa, MD   1 mg at 10/17/15 1205  . LORazepam (ATIVAN) tablet 0-4 mg  0-4 mg Oral Q6H Bettey Costa, MD   4 mg at 10/16/15 2020   Followed by  . [START ON 10/18/2015] LORazepam (ATIVAN) tablet 0-4 mg  0-4 mg Oral Q12H Sital Mody, MD      . morphine 2 MG/ML injection 2 mg  2 mg Intravenous Q4H PRN Harrie Foreman, MD   2 mg at 10/17/15 1408  . multivitamin with minerals tablet 1 tablet  1 tablet Oral Daily Bettey Costa, MD   1 tablet at 10/17/15 0924  . nicotine (NICOTROL) 10 MG inhaler 1 continuous puffing  1 continuous puffing Inhalation PRN Delman Kitten, MD   1 continuous puffing at 10/17/15 0933  . ondansetron (ZOFRAN) tablet 4 mg  4 mg Oral Q6H PRN Harrie Foreman, MD       Or  . ondansetron [facility]) injection 4 mg  4 mg Intravenous Q6H PRN Harrie Foreman, MD   4 mg at 10/16/15 1610  . ondansetron (ZOFRAN-ODT) disintegrating tablet 4 mg  4 mg Oral Once Delman Kitten, MD   Stopped at 10/16/15 [phone]  . oxyCODONE (Oxy IR/ROXICODONE) immediate release tablet 5 mg  5 mg Oral Q4H PRN Lytle Butte, MD   5 mg at 10/17/15 0601  . polyvinyl alcohol (LIQUIFILM TEARS) 1.4 % ophthalmic solution 1 drop  1 drop Both Eyes PRN Theodoro Grist, MD   1 drop at 10/17/15 0941  . sodium chloride 0.9 %  injection 3 mL  3 mL Intravenous Q12H Harrie Foreman, MD   3 mL at 10/16/15 2200  . thiamine (VITAMIN B-1) tablet 100 mg  100 mg Oral Daily Bettey Costa, MD   100 mg  at 10/17/15 9924   Or  . thiamine (B-1) injection 100 mg  100 mg Intravenous Daily Bettey Costa, MD        Musculoskeletal: Strength & Muscle Tone: within normal limits Gait & Station: unsteady Patient leans: N/A  Psychiatric Specialty Exam: Review of Systems  Constitutional: Positive for malaise/fatigue.  HENT: Negative.   Eyes: Negative.   Respiratory: Negative.   Cardiovascular: Negative.   Gastrointestinal: Negative.   Musculoskeletal: Negative.   Skin: Negative.   Neurological: Negative.   Psychiatric/Behavioral: Positive for depression, suicidal ideas, hallucinations, memory loss and substance abuse. The patient is nervous/anxious and has insomnia.     Blood pressure 136/80, pulse 75, temperature 97.5 F (36.4 C), temperature source Oral, resp. rate 12, height _0  (1.753 m), weight 76 kg (167 lb 8.8 oz), SpO2 99 %.Body mass index is 24.73 kg/(m^2).  General Appearance: Disheveled  Eye Contact::  Minimal  Speech:  Slow and Slurred  Volume:  Decreased  Mood:  Anxious and Dysphoric  Affect:  Depressed  Thought Process:  Circumstantial  Orientation:  Full (Time, Place, and Person)  Thought Content:  Hallucinations: Visual  Suicidal Thoughts:  Patient will not really give a clear answer about this. He denies that he wants to die right now but while he is trying to indicate a lack of recent suicidality I think he is aware that his story doesn't really hold up.  Homicidal Thoughts:  No  Memory:  Immediate;   Good Recent;   Fair Remote;   Fair  Judgement:  Impaired  Insight:  Lacking  Psychomotor Activity:  Decreased  Concentration:  Poor  Recall:  Poor  Fund of Knowledge:Fair  Language: Fair  Akathisia:  No  Handed:  Right  AIMS (if indicated):     Assets:  Housing Intimacy Social Support  ADL's:   Intact  Cognition: WNL  Sleep:      Treatment Plan Summary: Daily contact with patient to assess and evaluate symptoms and progress in treatment, Medication management and Plan This is a 34 year old man who came into the hospital after drinking between one and 2 bottles of NyQuil. He was also intoxicated at the time. Based on the interview today I have no confidence that he is currently safe. It sounds like this gentleman is probably had long-standing problems with mood and he is not being very forthcoming are cooperative about forming a treatment plan. Patient is likely to continue to be at an elevated risk of suicidal behavior particularly if drinking and being out of work are 2 of his biggest triggers. Additionally he tells me that he is having some visual hallucinations today that could be related to alcohol withdrawal. He seemed to indicate that he could have a history of DTs but not seizures. He is still in the window where it is possible that he could develop delirium tremens as well. Patient does not have any outpatient treatment in place and does not seem very motivated. Based on all of this I do not feel comfortable at all with discontinuing his commitment. I think he requires inpatient psychiatric evaluation and I will give him a diagnosis of major depression. Also alcohol abuse and possible need for more sophisticated alcohol detox. At the moment we did not have beds available on the psychiatry unit but we anticipate a likelihood of beds being available within the next 24 hours. Recommend continuing him in the hospital in his current situation. Monitor for any signs of withdrawal. I'm going to go ahead  and start a low dose of citalopram for depression. If patient is able to be admitted today we will do so otherwise we will probably need to contact the on-call psychiatrist over the weekend for follow-up.  Disposition: Recommend psychiatric Inpatient admission when medically cleared.  John  Clapacs 10/17/2015 2:24 PM

## 2015-10-17 NOTE — BHH Counselor (Signed)
Pt. [facility] to be [facility] by Dr. Toni Amendlapacs. Attending Physician will be Dr. Ardyth HarpsHernandez.  Pt. [facility] room 324, [facility] VandlingKenesha.  Writer confirmed IVC paper work was valid and current. Spoke with patient, gave him his intake packet and his pass code. This was witnessed by his assigned sitter Larita Fife(Lynn).    Medical Staff Alfredia Client(Marilynn, [facility]; Irving BurtonEmily, Patient's Nurse & [place], Patient Access) have been made aware of the admission.

## 2015-10-17 NOTE — Progress Notes (Signed)
Acetylcysteine consult  Pharmacy consulted to dose N-acetylcysteine for acetaminophen toxicity. Per Poison Control recommendation starting 150 mg/kg IV load followed by 15 mg/kg/hr infusion. Acetaminophen level, AST and ALT ordered for 01:00, 11/4.  11/4 01:00 LFT WNL, acetaminophen level <10. NAC drip d/c per conversation with poison control. Hospitalist notified.   Fulton ReekMatt Airiana Elman, PharmD, BCPS  10/17/2015

## 2015-10-17 NOTE — BHH Group Notes (Signed)
B[facility] Group Notes:  (Nursing/MHT/Case Management/Adjunct)  Date:  10/17/2015  Time:  10:22 PM  Type of Therapy:  Group Therapy  Participation Level:  Active  Participation Quality:  Appropriate and Attentive  Affect:  Appropriate  Cognitive:  Appropriate  Insight:  Appropriate  Engagement in Group:  Engaged  Modes of Intervention:  Support  Summary of Progress/Problems:  Charles Meyers 10/17/2015, 10:22 PM

## 2015-10-17 NOTE — Discharge Summary (Signed)
[facility] [place] [facility]   PATIENT NAME: Charles EmmerJoseph Schramm    MR#:  [id number]  DATE OF BIRTH:  07/07/1981  DATE OF ADMISSION:  10/15/2015 ADMITTING PHYSICIAN: Arnaldo NatalMichael S Diamond, MD  DATE OF DISCHARGE: 10/17/2015  PRIMARY CARE PHYSICIAN: No primary care provider on file.    ADMISSION DIAGNOSIS:  Intoxication [R69] Polysubstance abuse [F19.10] Alcohol intoxication, with unspecified complication (HCC) [F10.129] Overdose, intentional self-harm, initial encounter (HCC) [T50.902A]  DISCHARGE DIAGNOSIS:  Active Problems:   Tylenol toxicity   SECONDARY DIAGNOSIS:  No past medical history on file.  HOSPITAL COURSE:  34 year old male who drank EtOH and 2 bottles of NyQuil as suicidal attempt. For further details please refer the H&P.  1. Intentional overdose with EtOH and NyQuil: Patient needs further psychiatric evaluation. He had a one-to-one sitter while he was hospitalized. Psychiatry consultation appreciated. 2. Tylenol overdose: This was in NyQuil. Tylenol level is normal. LFTs are within normal limits. Patient was on NAC as per her conditions from poison control. 3. Depression: Patient needs psychiatric inpatient hospitalization and is medically cleared for discharge . 4. EtOH abuse: Patient was on CIWA protocol DISCHARGE CONDITIONS AND DIET:  Stable condition on a regular diet  CONSULTS OBTAINED:  Treatment Team:  Audery AmelJohn T Clapacs, MD  DRUG ALLERGIES:   Allergies  Allergen Reactions  . Ciprofloxacin     DISCHARGE MEDICATIONS:  There are no discharge medications for this patient.         Today   CHIEF COMPLAINT:  Patient voices depression   VITAL SIGNS:  Blood pressure 136/80, pulse 75, temperature 97.5 F (36.4 C), temperature source Oral, resp. rate 12, height 5\' 9"  (1.753 m), weight 76 kg (167 lb 8.8 oz), SpO2 99 %.   REVIEW OF SYSTEMS:  Review of Systems  Constitutional: Negative for fever, chills and  malaise/fatigue.  HENT: Negative for sore throat.   Eyes: Negative for blurred vision.  Respiratory: Negative for cough, hemoptysis, shortness of breath and wheezing.   Cardiovascular: Negative for chest pain, palpitations and leg swelling.  Gastrointestinal: Negative for nausea, vomiting, abdominal pain, diarrhea and blood in stool.  Genitourinary: Negative for dysuria.  Musculoskeletal: Negative for back pain.  Neurological: Negative for dizziness, tremors and headaches.  Endo/Heme/Allergies: Does not bruise/bleed easily.  Psychiatric/Behavioral: Positive for depression, suicidal ideas and substance abuse.     PHYSICAL EXAMINATION:  GENERAL:  34 y.o.-year-old patient lying in the bed with no acute distress.  NECK:  Supple, no jugular venous distention. No thyroid enlargement, no tenderness.  LUNGS: Normal breath sounds bilaterally, no wheezing, rales,rhonchi  No use of accessory muscles of respiration.  CARDIOVASCULAR: S1, S2 normal. No murmurs, rubs, or gallops.  ABDOMEN: Soft, non-tender, non-distended. Bowel sounds present. No organomegaly or mass.  EXTREMITIES: No pedal edema, cyanosis, or clubbing.  PSYCHIATRIC: The patient is alert and oriented x 3. Depressed appearing SKIN: No obvious rash, lesion, or ulcer. Tattoos on arms and a cut on his arm which is old  DATA REVIEW:   CBC  Recent Labs Lab 10/15/15 2035  WBC 10.4  HGB 15.0  HCT 42.7  PLT 286    Chemistries   Recent Labs Lab 10/15/15 2035 10/17/15 0114  NA 140  --   K 4.1  --   CL 104  --   CO2 24  --   GLUCOSE 125*  --   BUN 11  --   CREATININE 0.95  --   CALCIUM 9.0  --   AST 31  18  ALT 24 17  ALKPHOS 60  --   BILITOT 0.9  --     Cardiac Enzymes No results for input(s): TROPONINI in the last 168 hours.  Microbiology Results  @  RADIOLOGY:  No results found.    Management plans discussed with the patient and he is in agreement. Stable for discharge to behavioral  health  Patient should follow up with psychiatry  CODE STATUS:     Code Status Orders        Start     Ordered   10/16/15 0406  Full code   Continuous     10/16/15 0405      TOTAL TIME TAKING CARE OF THIS PATIENT: 35 minutes.    Note: This dictation was prepared with Dragon dictation along with smaller phrase technology. Any transcriptional errors that result from this process are unintentional.  Keya Wynes M.D on 10/17/2015 at 12:28 PM  Between 7am to 6pm - Pager - [phone] After 6pm go to [email or web address] - password [email or web address] [facility]: Lambert Rhonda W  [place] [facility]  Office  [phone]  CC: Primary care physician; No primary care provider on file.

## 2015-10-17 NOTE — Tx Team (Signed)
Initial Interdisciplinary Treatment Plan   PATIENT STRESSORS: Occupational concerns   PATIENT STRENGTHS: Ability for insight Capable of independent living Communication skills General fund of knowledge Motivation for treatment/growth Supportive family/friends   PROBLEM LIST: Problem List/Patient Goals Date to be addressed Date deferred Reason deferred Estimated date of resolution  Depression 10/17/15     Suicidal 10/17/15                                                DISCHARGE CRITERIA:  Improved stabilization in mood, thinking, and/or behavior  PRELIMINARY DISCHARGE PLAN: Outpatient therapy  PATIENT/FAMIILY INVOLVEMENT: This treatment plan has been presented to and reviewed with the patient, Charles Meyers, and/or family member.  The patient and family have been given the opportunity to ask questions and make suggestions.  Gretta ArabHerbin, Michille Mcelrath [facility] 10/17/2015, 6:27 PM

## 2015-10-17 NOTE — Progress Notes (Signed)
10/17/2015 6:00 PM  BP 108/84 mmHg  Pulse 74  Temp(Src) 98 F (36.7 C) (Oral)  Resp 16  Ht 5\' 9"  (1.753 m)  Wt 76 kg (167 lb 8.8 oz)  BMI 24.73 kg/m2  SpO2 100% Patient discharged per MD orders. Discharged to behavioral health unit in hospital per Dr. Toni Amendlapacs orders.  IV removed per policy. Belongings gathered and sent with patient under staff supervision. Security x3 and nursing staff present upon transfer to behavioral unit via wheelchair.   Ron ParkerHerron, Kaleea Penner D, RN

## 2015-10-18 DIAGNOSIS — F332 Major depressive disorder, recurrent severe without psychotic features: Secondary | ICD-10-CM

## 2015-10-18 MED ORDER — NICOTINE 21 MG/24HR TD PT24
21.0000 mg | MEDICATED_PATCH | Freq: Every day | TRANSDERMAL | Status: DC
  Administered 2015-10-18 – 2015-10-20 (×3): 21 mg via TRANSDERMAL
  Filled 2015-10-18 (×3): qty 1

## 2015-10-18 NOTE — Progress Notes (Signed)
First am patient irritable.  As day progressed mood improved.  Denies SI, depression or AVH.  Assessed per CIWA protocol.  Medicated x1.  Safety maintained.

## 2015-10-18 NOTE — Plan of Care (Signed)
Problem: Alteration in mood Goal: LTG-Patient reports reduction in suicidal thoughts (Patient reports reduction in suicidal thoughts and is able to verbalize a safety plan for whenever patient is feeling suicidal)  Outcome: Progressing Patient denies SI/HI.      

## 2015-10-18 NOTE — [facility] Group Notes (Signed)
[facility] Group Notes:  (Nursing/MHT/Case Management/Adjunct)  Date:  10/18/2015  Time:  11:44 PM  Type of Therapy:  Evening Wrap-up Group  Participation Level:  Active  Participation Quality:  Appropriate  Affect:  Appropriate  Cognitive:  Appropriate  Insight:  Good  Engagement in Group:  Developing/Improving  Modes of Intervention:  Discussion  Summary of Progress/Problems:  Tomasita MorrowChelsea Nanta Damar Petit 10/18/2015, 11:44 PM
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
## 2015-10-18 NOTE — Progress Notes (Signed)
D: Pt denies SI/HI/AVH. Pt is irritable, argumentative and hostile towards staff. Affect is flat and sad. Patient appears anxious and restless.   A: Pt was offered support and encouragement. Pt was given scheduled medications. Pt was encouraged to attend groups. Q 15 minute checks were done for safety.  R:Pt attends group and interacts well with peers and staff. Pt is taking medication. Pt has no complaints.Pt is not  receptive to treatment. Safety maintained on unit.

## 2015-10-18 NOTE — [facility] Group Notes (Signed)
[facility] LCSW Group Therapy  10/18/2015 3:27 PM  Type of Therapy:  Group Therapy  Participation Level:  Active  Participation Quality:  Attentive  Affect:  Irritable  Cognitive:  Alert  Insight:  Limited  Engagement in Therapy:  Limited  Modes of Intervention:  Education, Exploration, Socialization and Support  Summary of Progress/Problems: Todays topic: Grudges  Patients will be encouraged to discuss their thoughts, feelings, and behaviors as to why one holds on to grudges and reasons why people have grudges. Patients will process the impact of grudges on their daily lives and identify thoughts and feelings related to holding grudges. Patients will identify feelings and thoughts related to what life would look like without grudges. Joe attended group and stayed the entire time. He discussed a grudge caused by money. He states he gets over grudges by "swallowing your pride and swallowing a beer."   Sempra EnergyCandace L Jeanann Balinski MSW, LCSWA  10/18/2015, 3:27 PM

## 2015-10-18 NOTE — Plan of Care (Signed)
Problem: Alteration in mood Goal: LTG-Pt's behavior demonstrates decreased signs of depression (Patient's behavior demonstrates decreased signs of depression to the point the patient is safe to return home and continue treatment in an outpatient setting)  Outcome: Progressing Denies SI or depression.

## 2015-10-19 MED ORDER — HYDROXYZINE HCL 50 MG PO TABS
50.0000 mg | ORAL_TABLET | Freq: Every day | ORAL | Status: DC
  Administered 2015-10-19: 50 mg via ORAL
  Filled 2015-10-19: qty 1

## 2015-10-19 MED ORDER — LOPERAMIDE HCL 2 MG PO CAPS
2.0000 mg | ORAL_CAPSULE | ORAL | Status: DC | PRN
  Administered 2015-10-19: 2 mg via ORAL
  Filled 2015-10-19: qty 1

## 2015-10-19 NOTE — Progress Notes (Signed)
Verbalizes that he is during okay.  During am med pass, patient tremulous, complaining of nausea and guarded.  Ativan given.  Denies SI.  Medication compliant.  Stays to self with minimal interaction noted with peers or staff.
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
## 2015-10-19 NOTE — [facility] Group Notes (Signed)
[facility] LCSW Group Therapy  10/19/2015 2:05 PM  Type of Therapy:  Group Therapy  Participation Level:  Minimal  Participation Quality:  Attentive  Affect:  Appropriate  Cognitive:  Alert  Insight:  Improving  Engagement in Therapy:  Improving  Modes of Intervention:  Discussion, Education, Socialization and Support  Summary of Progress/Problems: Balance in life: Patients will discuss the concept of balance and how it looks and feels to be unbalanced. Pt will identify areas in their life that is unbalanced and ways to become more balanced.  Pt attended group and stayed the entire time. He sat quietly and listened to other group members.    Samauri Kellenberger L Tykia Mellone MSW, LCSWA  10/19/2015, 2:05 PM

## 2015-10-19 NOTE — Progress Notes (Signed)
[facility]  10/19/2015 9:19 PM Charles EmmerJoseph Herald  MRN:  [id number] Subjective:    Patient is a 34 year old male with history of depression who was admitted from the emergency room. Patient was seen for follow-up. He reported that he has started improving and is not experiencing any withdrawal symptoms at this time. He reported that he continues to have anxiety and interested in taking medication for the same. Patient currently denied having any suicidal ideations or plans. He reported that he is not interested in going to the rehabilitation program at this time. He denied having any perceptual disturbances. The staff has also been monitoring him closely and his behavior is appropriate. He attends most of the groups   .Principal Problem: MDD, recurrent severe  Diagnosis:   Patient Active Problem List   Diagnosis Date Noted  . Suicidal behavior [F48.9] 10/17/2015  . Major depression (HCC) [F32.9] 10/17/2015  . Alcohol abuse [F10.10] 10/17/2015  . Severe recurrent major depression without psychotic features (HCC) [F33.2] 10/17/2015  . Tylenol toxicity [T39.1X1A] 10/16/2015   Total Time spent with patient: 20 minutes    Past Medical History: History reviewed. No pertinent past medical history. History reviewed. No pertinent past surgical history. Family History: History reviewed. No pertinent family history. * Social History:  History  Alcohol Use  . Yes    Comment: refused to give specific amount.      History  Drug Use  . Yes  . Special: Marijuana    Social History   Social History  . Marital Status: Unknown    Spouse Name: N/A  . Number of Children: N/A  . Years of Education: N/A   Social History Main Topics  . Smoking status: Current Every Day Smoker    Types: Cigarettes  . Smokeless tobacco: None  . Alcohol Use: Yes     Comment: refused to give specific amount.   . Drug Use: Yes    Special: Marijuana  . Sexual Activity: Not Asked   Other Topics Concern   . None   Social History Narrative   Additional Social History:                         Sleep: Fair  Appetite:  Fair  Current Medications: Current Facility-Administered Medications  Medication Dose Route Frequency Provider Last Rate Last Dose  . alum & mag hydroxide-simeth (MAALOX/MYLANTA) 200-200-20 MG/5ML suspension 30 mL  30 mL Oral Q4H PRN Audery AmelJohn T Clapacs, MD      . folic acid (FOLVITE) tablet 1 mg  1 mg Oral Daily Audery AmelJohn T Clapacs, MD   1 mg at 10/19/15 0926  . Influenza vac split quadrivalent PF (FLUARIX) injection 0.5 mL  0.5 mL Intramuscular Tomorrow-1000 Jimmy FootmanAndrea Hernandez-Gonzalez, MD   0.5 mL at 10/18/15 1000  . LORazepam (ATIVAN) tablet 1 mg  1 mg Oral Q6H PRN Audery AmelJohn T Clapacs, MD   1 mg at 10/18/15 2147   Or  . LORazepam (ATIVAN) injection 1 mg  1 mg Intravenous Q6H PRN Audery AmelJohn T Clapacs, MD      . LORazepam (ATIVAN) tablet 0-4 mg  0-4 mg Oral Q12H John T Clapacs, MD      . magnesium hydroxide (MILK OF MAGNESIA) suspension 30 mL  30 mL Oral Daily PRN Audery AmelJohn T Clapacs, MD      . multivitamin with minerals tablet 1 tablet  1 tablet Oral Daily Audery AmelJohn T Clapacs, MD   1 tablet at 10/19/15 0926  . nicotine (  NICODERM CQ - dosed in mg/24 hours) patch 21 mg  21 mg Transdermal Daily Brandy Hale, MD   21 mg at 10/19/15 0926  . pneumococcal 23 valent vaccine (PNU-IMMUNE) injection 0.5 mL  0.5 mL Intramuscular Tomorrow-1000 Jimmy Footman, MD   0.5 mL at 10/18/15 1000  . thiamine (VITAMIN B-1) tablet 100 mg  100 mg Oral Daily Audery Amel, MD   100 mg at 10/19/15 1610    Lab Results: No results found for this or any previous visit (from the past 48 hour(s)).  Physical Findings: AIMS: Facial and Oral Movements Muscles of Facial Expression: None, normal Lips and Perioral Area: None, normal Jaw: None, normal Tongue: None, normal,Extremity Movements Upper (arms, wrists, hands, fingers): None, normal Lower (legs, knees, ankles, toes): None, normal, Trunk Movements Neck,  shoulders, hips: None, normal, Overall Severity Severity of abnormal movements (highest score from questions above): None, normal Incapacitation due to abnormal movements: None, normal Patient's awareness of abnormal movements (rate only patient's report): No Awareness, Dental Status Current problems with teeth and/or dentures?: No Does patient usually wear dentures?: No  CIWA:  CIWA-Ar Total: 0 COWS:     Musculoskeletal: Strength & Muscle Tone: within normal limits Gait & Station: normal Patient leans: N/A  Psychiatric Specialty Exam: ROS  Blood pressure 126/72, pulse 81, temperature 98.2 F (36.8 C), temperature source Oral, resp. rate 20, height  (1.753 m), weight 167 lb 8 oz (75.978 kg), SpO2 100 %.Body mass index is 24.72 kg/(m^2).  General Appearance: Casual and Fairly Groomed  Patent attorney::  Fair  Speech:  Clear and Coherent  Volume:  Decreased  Mood:  Anxious and Depressed  Affect:  Appropriate and Congruent  Thought Process:  Coherent  Orientation:  Full (Time, Place, and Person)  Thought Content:  WDL  Suicidal Thoughts:  No  Homicidal Thoughts:  No  Memory:  Immediate;   Fair  Judgement:  Fair  Insight:  Fair  Psychomotor Activity:  Normal  Concentration:  Fair  Recall:  [employer] of Knowledge:Fair  Language: Fair  Akathisia:  No  Handed:  Right  AIMS (if indicated):     Assets:  Communication Skills Desire for Improvement Leisure Time Social Support  ADL's:  Intact  Cognition: WNL  Sleep:  Number of Hours: 6.25   Treatment Plan Summary: Daily contact with patient to assess and evaluate symptoms and progress in treatment and Medication management   Continue on CIWA  protocol at this time. Disposition Social work to help with disposition   Brandy Hale 10/19/2015, 9:19 PM

## 2015-10-19 NOTE — Plan of Care (Signed)
Problem: Diagnosis: Increased Risk For Suicide Attempt Goal: LTG-Patient Will Report Improvement in Psychotic Symptoms LTG (by discharge) : Patient will report improvement in psychotic symptoms.  Outcome: Progressing Alert and oriented.   No signs of hallucinations.

## 2015-10-19 NOTE — Progress Notes (Signed)
Patient denies SI. Med compliant. Negative CIWA scores during shift. Patient isolates to room but comes out for medications. No unsafe behaviors noted.

## 2015-10-19 NOTE — Plan of Care (Signed)
Problem: Alteration in mood Goal: LTG-Patient reports reduction in suicidal thoughts (Patient reports reduction in suicidal thoughts and is able to verbalize a safety plan for whenever patient is feeling suicidal)  Outcome: Progressing Patient denies SI.      

## 2015-10-20 DIAGNOSIS — F10229 Alcohol dependence with intoxication, unspecified: Secondary | ICD-10-CM

## 2015-10-20 DIAGNOSIS — F10939 Alcohol use, unspecified with withdrawal, unspecified: Secondary | ICD-10-CM

## 2015-10-20 DIAGNOSIS — F10239 Alcohol dependence with withdrawal, unspecified: Secondary | ICD-10-CM

## 2015-10-20 DIAGNOSIS — F1020 Alcohol dependence, uncomplicated: Secondary | ICD-10-CM

## 2015-10-20 DIAGNOSIS — F3289 Other specified depressive episodes: Secondary | ICD-10-CM

## 2015-10-20 DIAGNOSIS — F1024 Alcohol dependence with alcohol-induced mood disorder: Principal | ICD-10-CM

## 2015-10-20 DIAGNOSIS — F17200 Nicotine dependence, unspecified, uncomplicated: Secondary | ICD-10-CM

## 2015-10-20 MED ORDER — HYDROXYZINE HCL 50 MG PO TABS: 50.0000 mg | ORAL_TABLET | Freq: Every day | ORAL | Status: DC

## 2015-10-20 MED ORDER — CHLORDIAZEPOXIDE HCL 25 MG PO CAPS: 25.0000 mg | ORAL_CAPSULE | Freq: Three times a day (TID) | ORAL | Status: DC | PRN

## 2015-10-20 NOTE — [facility] Group Notes (Signed)
[facility] Group Notes:  (Nursing/MHT/Case Management/Adjunct)  Date:  10/20/2015  Time:  1:52 PM  Type of Therapy:  Psychoeducational Skills  Participation Level:  Did Not Attend  Charles Meyers 10/20/2015, 1:52 PM 

## 2015-10-20 NOTE — Plan of Care (Signed)
Problem: Ineffective individual coping Goal: LTG: Patient will report a decrease in negative feelings Outcome: Progressing Patient talks with nurse openly, states that he does want to stay sober and never wants to go thru this again, denies suicidal ideations. Patient is pleasant and has been attending all groups.

## 2015-10-20 NOTE — Plan of Care (Signed)
Problem: Alteration in mood Goal: LTG-Patient reports reduction in suicidal thoughts (Patient reports reduction in suicidal thoughts and is able to verbalize a safety plan for whenever patient is feeling suicidal)  Outcome: Progressing Patient denies SI.      

## 2015-10-20 NOTE — Progress Notes (Signed)
Patient is alert and oriented, takes medications without difficulty, patient is interested in groups and all activities, states that He does feel better and hopes to leave soon.

## 2015-10-20 NOTE — Progress Notes (Signed)
Patient denies SI. Med compliant. Patient has c/o of diarrhea and insomnia during shift. Patient  comes out for medications and spends a little time in the day room with peers. No unsafe behaviors noted.

## 2015-10-20 NOTE — Discharge Summary (Addendum)
Patient discharged, Patient's belongings given to patient and also prescriptions, and his medication from the pharmacy given to him for insomnia. Patient is oriented, no s/s of distress, patient transported by girlfriend Andrey Campanile([place]) home. Patient without complaints of injection for flu vaccine, Patient voiced understanding of possible side effects of vaccine.

## 2015-10-20 NOTE — [facility] Suicide Risk Assessment (Addendum)
[facility]   Demographic Factors:  Male, Caucasian and Unemployed  Total Time spent with patient: 30 minutes   Psychiatric Specialty Exam: Physical Exam  ROS                                                         Have you used any form of tobacco in the last 30 days? (Cigarettes, Smokeless Tobacco, Cigars, and/or Pipes): Yes  Has this patient used any form of tobacco in the last 30 days? (Cigarettes, Smokeless Tobacco, Cigars, and/or Pipes) Yes, A prescription for an FDA-approved tobacco cessation medication was offered at discharge and the patient refused  Mental Status Per Nursing Assessment::   On Admission:  NA  Current Mental Status by Physician: denies SI, HI or hallucinations. Mood is "good", Affect is bright and reactive.  Pleasant and cooperative.   Loss Factors: Financial problems/change in socioeconomic status  Historical Factors: Impulsivity  Risk Reduction Factors:   Sense of responsibility to family, Living with another person, especially a relative and Positive social support  Continued Clinical Symptoms:  Alcohol/Substance Abuse/Dependencies Previous Psychiatric Diagnoses and Treatments  Cognitive Features That Contribute To Risk:  None    Suicide Risk:  Minimal: No identifiable suicidal ideation.  Patients presenting with no risk factors but with morbid ruminations; may be classified as minimal risk based on the severity of the depressive symptoms  Principal Problem: Alcohol-induced depressive disorder with moderate or severe use disorder with onset during intoxication [facility](HCC) Discharge Diagnoses:  Patient Active Problem List   Diagnosis Date Noted  . Alcohol withdrawal (HCC) [F10.239] 10/20/2015  . Alcohol use disorder, severe, dependence (HCC) [F10.20] 10/20/2015  . Tobacco use disorder [F17.200] 10/20/2015  . Alcohol-induced depressive disorder with moderate or severe use disorder with onset  during intoxication (HCC) [F10.24, F10.229, F32.89] 10/20/2015  . Severe recurrent major depression without psychotic features (HCC) [F33.2] 10/17/2015    Is patient on multiple antipsychotic therapies at discharge:  No   Has Patient had three or more failed trials of antipsychotic monotherapy by history:  No  Recommended Plan for Multiple Antipsychotic Therapies: NA    Jimmy FootmanHernandez-Gonzalez,  Dakiya Puopolo 10/20/2015, 11:24 AM
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
## 2016-03-30 ENCOUNTER — Emergency Department: Admission: EM | Admit: 2016-03-30 | Discharge: 2016-03-30 | Discharge status: Absent without leave | Payer: Self-pay

## 2016-11-30 ENCOUNTER — Emergency Department (HOSPITAL_BASED_OUTPATIENT_CLINIC_OR_DEPARTMENT_OTHER)
Admission: EM | Admit: 2016-11-30 | Discharge: 2016-11-30 | Disposition: A | Discharge status: Home / Self Care | Payer: Self-pay | Attending: Emergency Medicine | Admitting: Emergency Medicine

## 2016-11-30 ENCOUNTER — Encounter (HOSPITAL_BASED_OUTPATIENT_CLINIC_OR_DEPARTMENT_OTHER): Payer: Self-pay | Admitting: Emergency Medicine

## 2016-11-30 DIAGNOSIS — J111 Influenza due to unidentified influenza virus with other respiratory manifestations: Secondary | ICD-10-CM | POA: Insufficient documentation

## 2016-11-30 DIAGNOSIS — J0100 Acute maxillary sinusitis, unspecified: Secondary | ICD-10-CM

## 2016-11-30 DIAGNOSIS — F17210 Nicotine dependence, cigarettes, uncomplicated: Secondary | ICD-10-CM | POA: Insufficient documentation

## 2016-11-30 DIAGNOSIS — R69 Illness, unspecified: Secondary | ICD-10-CM

## 2016-11-30 MED ORDER — IBUPROFEN 800 MG PO TABS
800.0000 mg | ORAL_TABLET | Freq: Once | ORAL | Status: AC
  Administered 2016-11-30: 800 mg via ORAL
  Filled 2016-11-30: qty 1

## 2016-11-30 MED ORDER — BENZONATATE 100 MG PO CAPS: 100.0000 mg | ORAL_CAPSULE | Freq: Three times a day (TID) | ORAL | 0 refills | Status: DC

## 2016-11-30 MED ORDER — AMOXICILLIN-POT CLAVULANATE 875-125 MG PO TABS: 1.0000 | ORAL_TABLET | Freq: Two times a day (BID) | ORAL | 0 refills | Status: AC

## 2016-11-30 MED ORDER — BENZONATATE 100 MG PO CAPS
100.0000 mg | ORAL_CAPSULE | Freq: Once | ORAL | Status: AC
  Administered 2016-11-30: 100 mg via ORAL
  Filled 2016-11-30: qty 1

## 2016-11-30 MED ORDER — ACETAMINOPHEN 500 MG PO TABS
1000.0000 mg | ORAL_TABLET | Freq: Once | ORAL | Status: AC
  Administered 2016-11-30: 1000 mg via ORAL
  Filled 2016-11-30: qty 2

## 2016-11-30 MED ORDER — AMOXICILLIN-POT CLAVULANATE 875-125 MG PO TABS
1.0000 | ORAL_TABLET | Freq: Once | ORAL | Status: AC
  Administered 2016-11-30: 1 via ORAL
  Filled 2016-11-30: qty 1

## 2016-11-30 NOTE — ED Provider Notes (Signed)
[facility] Provider Note   CSN: [id number] Arrival date & time: 11/30/16  2136  By signing my name below, I, Emmanuella Mensah, attest that this documentation has been prepared under the direction and in the presence of Melene Planan Timothey Dahlstrom, DO. Electronically Signed: Angelene GiovanniEmmanuella Mensah, ED Scribe. 11/30/16. 10:43 PM.   History   Chief Complaint No chief complaint on file.   HPI Comments: Charles EmmerJoseph Meyers is a 35 y.o. male who presents to the Emergency Department complaining of gradually worsening moderate persistent non-productive cough onset one week ago. He reports associated subjective fever, nausea, multiple episodes of non-bloody vomiting, nasal congestion, and generalized body aches with fatigue. He states that he has also been experiencing intermittent panic attacks and reports that he is not sure why he is having these episodes. He adds that he woke up with swelling around left eye and believes he was bitten by an insect overnight. No alleviating factors noted. Pt has not tried any medications PTA. He has an allergy to Ciprofloxacin. He reports sick contacts at home. He denies any chest pain, shortness of breath, congestion, or generalized rash.   He also reports persistent groin pain and reports that he has an upcoming appointment with a surgeon. No other complaints at this time.   The history is provided by the patient. No language interpreter was used.  Influenza  Presenting symptoms: cough, fever, myalgias, nausea and vomiting   Presenting symptoms: no diarrhea, no headaches and no shortness of breath   Severity:  Moderate Duration:  1 week Progression:  Worsening Chronicity:  New Relieved by:  None tried Worsened by:  Nothing Ineffective treatments:  None tried Associated symptoms: no chills and no congestion   Risk factors: sick contacts     History reviewed. No pertinent past medical history.  Patient Active Problem List   Diagnosis Date Noted  . Alcohol  withdrawal (HCC) 10/20/2015  . Alcohol use disorder, severe, dependence (HCC) 10/20/2015  . Tobacco use disorder 10/20/2015  . 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 No pertinent surgical history.     Home Medications    Prior to Admission medications   Medication Sig Start Date End Date Taking? Authorizing Provider  amoxicillin-clavulanate (AUGMENTIN) 875-125 MG tablet Take 1 tablet by mouth every 12 (twelve) hours. 11/30/16 12/14/16  Melene Planan Reeves Musick, DO  benzonatate (TESSALON) 100 MG capsule Take 1 capsule (100 mg total) by mouth every 8 (eight) hours. 11/30/16   Melene Planan Taevin Mcferran, DO  chlordiazePOXIDE (LIBRIUM) 25 MG capsule Take 1 capsule (25 mg total) by mouth 3 (three) times daily as needed for anxiety. 10/20/15   Jimmy FootmanAndrea Hernandez-Gonzalez, MD  hydrOXYzine (ATARAX/VISTARIL) 50 MG tablet Take 1 tablet (50 mg total) by mouth at bedtime. 10/20/15   Jimmy FootmanAndrea Hernandez-Gonzalez, MD    Family History History reviewed. No pertinent family history.  Social History Social History  Substance Use Topics  . Smoking status: Current Every Day Smoker    Types: Cigarettes  . Smokeless tobacco: Never Used  . Alcohol use Yes     Comment: refused to give specific amount.      Allergies   Ciprofloxacin   Review of Systems Review of Systems  Constitutional: Positive for fever. Negative for chills.  HENT: Negative for congestion and facial swelling.   Eyes: Negative for discharge and visual disturbance.  Respiratory: Positive for cough. Negative for shortness of breath.   Cardiovascular: Negative for chest pain and palpitations.  Gastrointestinal: Positive for nausea and vomiting. Negative for abdominal pain and diarrhea.  Musculoskeletal: Positive for myalgias. Negative for arthralgias.  Skin: Negative for color change and rash.  Neurological: Negative for tremors, syncope and headaches.    Psychiatric/Behavioral: Negative for confusion and dysphoric mood. The patient is nervous/anxious.      Physical Exam Updated Vital Signs BP (!) 122/109 (BP Location: Right Arm) Comment: Patient tensing up arm and shaking   Pulse 91   Temp 98.2 F (36.8 C) (Oral)   Resp 18   Ht 5\' 9"  (1.753 m)   Wt 160 lb (72.6 kg)   SpO2 98%   BMI 23.63 kg/m   Physical Exam  Constitutional: He is oriented to person, place, and time. He appears well-developed and well-nourished.  HENT:  Head: Normocephalic and atraumatic.  Nose: Right sinus exhibits maxillary sinus tenderness.  Bilateral swollen nasal turbinates  Post nasal drip  Severe right maxillary sinus tenderness   Eyes: EOM are normal. Pupils are equal, round, and reactive to light.  Neck: Normal range of motion. Neck supple. No JVD present.  Cardiovascular: Normal rate and regular rhythm.  Exam reveals no gallop and no friction rub.   No murmur heard. Pulmonary/Chest: No respiratory distress. He has no wheezes.  Abdominal: He exhibits no distension. There is no rebound and no guarding.  Musculoskeletal: Normal range of motion.  Neurological: He is alert and oriented to person, place, and time.  Skin: No rash noted. No pallor.  Psychiatric: He has a normal mood and affect. His behavior is normal.  Nursing note and vitals reviewed.    ED Treatments / Results  DIAGNOSTIC STUDIES: Oxygen Saturation is 98% on RA, normal by my interpretation.    COORDINATION OF CARE: 10:36 PM- Pt advised of plan for treatment and pt agrees. Pt will receive Tylenol, Ibuprofen, Tessalon, and Augmentin.   Labs (all labs ordered are listed, but only abnormal results are displayed) Labs Reviewed - No data to display  EKG  EKG Interpretation None       Radiology No results found.  Procedures Procedures (including critical care time)  Medications Ordered in ED Medications  acetaminophen (TYLENOL) tablet 1,000 mg (1,000 mg Oral Given  11/30/16 2243)  ibuprofen (ADVIL,MOTRIN) tablet 800 mg (800 mg Oral Given 11/30/16 2243)  benzonatate (TESSALON) capsule 100 mg (100 mg Oral Given 11/30/16 2243)  amoxicillin-clavulanate (AUGMENTIN) 875-125 MG per tablet 1 tablet (1 tablet Oral Given 11/30/16 2243)     Initial Impression / Assessment and Plan / ED Course  Melene Planan Jarrett Albor, DO has reviewed the triage vital signs and the nursing notes.  Pertinent labs & imaging results that were available during my care of the patient were reviewed by me and considered in my medical decision making (see chart for details).  Clinical Course     35 yo M With a chief complaint of URI like symptoms. Going on for the past week or so. He feels like they have been getting worse. He is describing some severe sinus tenderness on percussion. Will treat as possible sinusitis. I suspect this is most likely an influenza-like illness. Discharge home.  10:49 PM:  I have discussed the diagnosis/risks/treatment options with the patient and believe the pt to be eligible for discharge home to follow-up with PCP. We also discussed returning to the ED immediately if new or worsening sx occur. We discussed the sx which are most concerning (e.g., sudden worsening pain, fever, inability to tolerate by mouth) that necessitate immediate return. Medications  administered to the patient during their visit and any new prescriptions provided to the patient are listed below.  Medications given during this visit Medications  acetaminophen (TYLENOL) tablet 1,000 mg (1,000 mg Oral Given 11/30/16 2243)  ibuprofen (ADVIL,MOTRIN) tablet 800 mg (800 mg Oral Given 11/30/16 2243)  benzonatate (TESSALON) capsule 100 mg (100 mg Oral Given 11/30/16 2243)  amoxicillin-clavulanate (AUGMENTIN) 875-125 MG per tablet 1 tablet (1 tablet Oral Given 11/30/16 2243)     The patient appears reasonably screen and/or stabilized for discharge and I doubt any other medical condition or other [facility] requiring  further screening, evaluation, or treatment in the ED at this time prior to discharge.    Final Clinical Impressions(s) / ED Diagnoses   Final diagnoses:  Acute maxillary sinusitis, recurrence not specified  Influenza-like illness    New Prescriptions New Prescriptions   AMOXICILLIN-CLAVULANATE (AUGMENTIN) 875-125 MG TABLET    Take 1 tablet by mouth every 12 (twelve) hours.   BENZONATATE (TESSALON) 100 MG CAPSULE    Take 1 capsule (100 mg total) by mouth every 8 (eight) hours.    I personally performed the services described in this documentation, which was scribed in my presence. The recorded information has been reviewed and is accurate.     Melene Plan, DO 11/30/16 2249

## 2016-11-30 NOTE — Discharge Instructions (Signed)
Take tylenol 2 pills 4 times a day and motrin 4 pills 3 times a day.  Drink plenty of fluids.  Return for worsening shortness of breath, headache, confusion. Follow up with your family doctor.   

## 2016-11-30 NOTE — ED Triage Notes (Signed)
Patient has multiple complaints, chest pain, anxiety chills aches and "just don't feel good"

## 2018-06-28 ENCOUNTER — Other Ambulatory Visit: Payer: Self-pay

## 2018-06-28 ENCOUNTER — Encounter (HOSPITAL_BASED_OUTPATIENT_CLINIC_OR_DEPARTMENT_OTHER): Payer: Self-pay

## 2018-06-28 ENCOUNTER — Emergency Department (HOSPITAL_BASED_OUTPATIENT_CLINIC_OR_DEPARTMENT_OTHER)
Admission: EM | Admit: 2018-06-28 | Discharge: 2018-06-28 | Disposition: A | Discharge status: Home / Self Care | Payer: Self-pay | Attending: Emergency Medicine | Admitting: Emergency Medicine

## 2018-06-28 DIAGNOSIS — Z79899 Other long term (current) drug therapy: Secondary | ICD-10-CM | POA: Insufficient documentation

## 2018-06-28 DIAGNOSIS — R112 Nausea with vomiting, unspecified: Secondary | ICD-10-CM | POA: Insufficient documentation

## 2018-06-28 DIAGNOSIS — F17210 Nicotine dependence, cigarettes, uncomplicated: Secondary | ICD-10-CM | POA: Insufficient documentation

## 2018-06-28 DIAGNOSIS — R197 Diarrhea, unspecified: Secondary | ICD-10-CM | POA: Insufficient documentation

## 2018-06-28 MED ORDER — ONDANSETRON HCL 4 MG PO TABS: 4.0000 mg | ORAL_TABLET | Freq: Three times a day (TID) | ORAL | 0 refills | Status: AC | PRN

## 2018-06-28 MED ORDER — ONDANSETRON 4 MG PO TBDP
4.0000 mg | ORAL_TABLET | Freq: Once | ORAL | Status: AC
  Administered 2018-06-28: 4 mg via ORAL
  Filled 2018-06-28: qty 1

## 2018-06-28 NOTE — ED Triage Notes (Signed)
Pt c/o fever, n/v/d x 3 days-pt  [facility] ED-NAD-steady gait

## 2018-06-28 NOTE — Discharge Instructions (Signed)
Today you were seen in the ED for nausea, vomiting and diarrhea. No workup for done per your request. You can take imodium over the counter for diarrhea. Continue tylenol for fever control. I have given a prescription for Zofran for the nausea. Please return to the ED if you experience any high fever, chest pain or shortness of breath.

## 2018-06-28 NOTE — ED Provider Notes (Signed)
[facility] EMERGENCY DEPARTMENT Provider Note   CSN: [id number] Arrival date & time: 06/28/18  1217     History   Chief Complaint Chief Complaint  Patient presents with  . Fever    HPI Charles Meyers is a 37 y.o. male.  37 y.o male with no PMH presents to the ED with a chief complaint of fever x 3 days. Patient reports abdominal pain, nausea, two episodes of emesis and three episodes of diarrhea today. Patient describes his abdominal pain as "strain" exacerbated by vomiting. He has tried pepto, aleve and ibuprofen and states this stopped the fever but his nausea still persistent. Patient states one of the kids at home has been sick with vomiting and diarrhea. He denies any chest pain, shortness of breath or dysuria.      History reviewed. No pertinent past medical history.  Patient Active Problem List   Diagnosis Date Noted  . Alcohol withdrawal (HCC) 10/20/2015  . Alcohol use disorder, severe, dependence (HCC) 10/20/2015  . Tobacco use disorder 10/20/2015  . 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 URETHRAL DILATION          Home Medications    Prior to Admission medications   Medication Sig Start Date End Date Taking? Authorizing Provider  benzonatate (TESSALON) 100 MG capsule Take 1 capsule (100 mg total) by mouth every 8 (eight) hours. 11/30/16   Melene PlanFloyd, Dan, DO  chlordiazePOXIDE (LIBRIUM) 25 MG capsule Take 1 capsule (25 mg total) by mouth 3 (three) times daily as needed for anxiety. 10/20/15   Jimmy FootmanHernandez-Gonzalez, Andrea, MD  hydrOXYzine (ATARAX/VISTARIL) 50 MG tablet Take 1 tablet (50 mg total) by mouth at bedtime. 10/20/15   Jimmy FootmanHernandez-Gonzalez, Andrea, MD  ondansetron (ZOFRAN) 4 MG tablet Take 1 tablet (4 mg total) by mouth every 8 (eight) hours as needed for up to 3 days for nausea or vomiting. 06/28/18 07/01/18  Claude MangesSoto,  Margree Gimbel, PA-C    Family History No family history on file.  Social History Social History   Tobacco Use  . Smoking status: Current Every Day Smoker    Types: Cigarettes  . Smokeless tobacco: Never Used  Substance Use Topics  . Alcohol use: Not Currently    Frequency: Never  . Drug use: Not Currently     Allergies   Ciprofloxacin and Morphine and related   Review of Systems Review of Systems  HENT: Negative for rhinorrhea and sore throat.   Respiratory: Negative for shortness of breath and wheezing.   Cardiovascular: Negative for chest pain and palpitations.  Gastrointestinal: Positive for abdominal pain, diarrhea, nausea and vomiting.  Genitourinary: Negative for dysuria and flank pain.  Musculoskeletal: Negative for back pain.  Neurological: Negative for light-headedness.  All other systems reviewed and are negative.    Physical Exam Updated Vital Signs BP 140/72 (BP Location: Right Arm)   Pulse 72   Temp 98.1 F (36.7 C) (Oral)   Resp 18   Ht 5\' 9"  (1.753 m)   Wt 72.6 kg (160 lb)   SpO2 100%   BMI 23.63 kg/m   Physical Exam  Constitutional: He is oriented to person, place, and time. He appears well-developed and well-nourished.  HENT:  Head: Normocephalic and atraumatic.  Eyes: Pupils are equal, round, and reactive to light.  Neck: Normal range of motion.  Cardiovascular: Normal rate.  Pulmonary/Chest: Effort normal and breath sounds normal. No respiratory  distress. He has no wheezes.  Abdominal: Soft. Bowel sounds are normal. He exhibits no distension. There is no tenderness.  Musculoskeletal: He exhibits no edema, tenderness or deformity.  Neurological: He is alert and oriented to person, place, and time.  Skin: Skin is warm and dry.  Nursing note and vitals reviewed.    ED Treatments / Results  Labs (all labs ordered are listed, but only abnormal results are displayed) Labs Reviewed - No data to display  EKG None  Radiology No results  found.  Procedures Procedures (including critical care time)  Medications Ordered in ED Medications  ondansetron (ZOFRAN-ODT) disintegrating tablet 4 mg (4 mg Oral Given 06/28/18 1443)     Initial Impression / Assessment and Plan / ED Course  I have reviewed the triage vital signs and the nursing notes.  Pertinent labs & imaging results that were available during my care of the patient were reviewed by me and considered in my medical decision making (see chart for details).     He reports his fever has subsided, he is not actively vomiting, but states the nausea persists. Patient does not want any laboratory workup at this time.He states he is feeling much better, and he would just like symptomatic treatment.He states kids are sick at home with similar symptoms.   I have discussed with patient that I cannot provide a diagnose for  him because he is refusing to have abdominal workup done. He is aware that I cannot provide antibiotic treatment like he requested because I am unable to diagnosed what is causing his symptoms. Patient understands that if his symptoms worsen he can come back to seek abdominal workup. Abigal H. PA and I have discussed with patient,he seems to understand the risks of leaving without workup. Return precautions have been provided along with a return to work note.   Final Clinical Impressions(s) / ED Diagnoses   Final diagnoses:  Nausea vomiting and diarrhea    ED Discharge Orders        Ordered    ondansetron (ZOFRAN) 4 MG tablet  Every 8 hours PRN     06/28/18 1425       Claude Manges, PA-C 06/28/18 1459    Azalia Bilis, MD 06/29/18 1025

## 2018-08-22 ENCOUNTER — Other Ambulatory Visit: Payer: Self-pay

## 2018-08-22 ENCOUNTER — Encounter (HOSPITAL_BASED_OUTPATIENT_CLINIC_OR_DEPARTMENT_OTHER): Payer: Self-pay

## 2018-08-22 ENCOUNTER — Emergency Department (HOSPITAL_BASED_OUTPATIENT_CLINIC_OR_DEPARTMENT_OTHER): Payer: Self-pay

## 2018-08-22 ENCOUNTER — Emergency Department (HOSPITAL_BASED_OUTPATIENT_CLINIC_OR_DEPARTMENT_OTHER)
Admission: EM | Admit: 2018-08-22 | Discharge: 2018-08-22 | Disposition: A | Discharge status: Home / Self Care | Payer: Self-pay | Attending: Emergency Medicine | Admitting: Emergency Medicine

## 2018-08-22 DIAGNOSIS — W228XXA Striking against or struck by other objects, initial encounter: Secondary | ICD-10-CM | POA: Insufficient documentation

## 2018-08-22 DIAGNOSIS — M545 Low back pain, unspecified: Secondary | ICD-10-CM

## 2018-08-22 DIAGNOSIS — G8929 Other chronic pain: Secondary | ICD-10-CM

## 2018-08-22 DIAGNOSIS — Z79899 Other long term (current) drug therapy: Secondary | ICD-10-CM | POA: Insufficient documentation

## 2018-08-22 DIAGNOSIS — H6691 Otitis media, unspecified, right ear: Secondary | ICD-10-CM | POA: Insufficient documentation

## 2018-08-22 DIAGNOSIS — S99922A Unspecified injury of left foot, initial encounter: Secondary | ICD-10-CM | POA: Insufficient documentation

## 2018-08-22 DIAGNOSIS — Y9389 Activity, other specified: Secondary | ICD-10-CM | POA: Insufficient documentation

## 2018-08-22 DIAGNOSIS — Y929 Unspecified place or not applicable: Secondary | ICD-10-CM | POA: Insufficient documentation

## 2018-08-22 DIAGNOSIS — F1020 Alcohol dependence, uncomplicated: Secondary | ICD-10-CM | POA: Insufficient documentation

## 2018-08-22 DIAGNOSIS — Y999 Unspecified external cause status: Secondary | ICD-10-CM | POA: Insufficient documentation

## 2018-08-22 DIAGNOSIS — F17210 Nicotine dependence, cigarettes, uncomplicated: Secondary | ICD-10-CM | POA: Insufficient documentation

## 2018-08-22 DIAGNOSIS — H6690 Otitis media, unspecified, unspecified ear: Secondary | ICD-10-CM

## 2018-08-22 MED ORDER — AMOXICILLIN 500 MG PO CAPS
500.0000 mg | ORAL_CAPSULE | Freq: Three times a day (TID) | ORAL | 0 refills | Status: DC
Start: 2018-08-22 — End: 2019-12-10

## 2018-08-22 MED ORDER — IBUPROFEN 800 MG PO TABS
800.0000 mg | ORAL_TABLET | Freq: Once | ORAL | Status: AC
  Administered 2018-08-22: 800 mg via ORAL
  Filled 2018-08-22: qty 1

## 2018-08-22 MED ORDER — METHOCARBAMOL 500 MG PO TABS: 500.0000 mg | ORAL_TABLET | Freq: Three times a day (TID) | ORAL | 0 refills | Status: DC | PRN

## 2018-08-22 MED ORDER — NAPROXEN 500 MG PO TABS: 500.0000 mg | ORAL_TABLET | Freq: Two times a day (BID) | ORAL | 0 refills | Status: DC

## 2018-08-22 NOTE — ED Triage Notes (Signed)
Pt c/o rt toe pain since Friday also chronic back pain and rt ear pain

## 2018-08-22 NOTE — Discharge Instructions (Signed)
You were seen in the emergency department today for ear pain, back pain, and a left foot injury.   Your ear appears infected, we are placing on amoxicillin, antibiotic, to help treat the infection. The x-ray of your left foot did not show any fractures or dislocations.  We are sending you home with prescription for naproxen and Robaxin to help with pain.  - Naproxen is a nonsteroidal anti-inflammatory medication that will help with pain and swelling. Be sure to take this medication as prescribed with food, 1 pill every 12 hours,  It should be taken with food, as it can cause stomach upset, and more seriously, stomach bleeding. Do not take other nonsteroidal anti-inflammatory medications with this such as Advil, Motrin, Aleve, Mobic, Goodie Powder, or Motrin.    - Robaxin is the muscle relaxer I have prescribed, this is meant to help with muscle tightness. Be aware that this medication may make you drowsy therefore the first time you take this it should be at a time you are in an environment where you can rest. Do not drive or operate heavy machinery when taking this medication. Do not drink alcohol or take other sedating medications with this medicine such as narcotics or benzodiazepines.   You make take Tylenol per over the counter dosing with these medications.   We have prescribed you new medication(s) today. Discuss the medications prescribed today with your pharmacist as they can have adverse effects and interactions with your other medicines including over the counter and prescribed medications. Seek medical evaluation if you start to experience new or abnormal symptoms after taking one of these medicines, seek care immediately if you start to experience difficulty breathing, feeling of your throat closing, facial swelling, or rash as these could be indications of a more serious allergic reaction  Recommend applying ice 20 minutes on 40 minutes off for the next 48 hours to your foot.  You are  still having significant discomfort within 1 week we recommend that you follow-up with a primary care provider or with Dr. Pearletha Forge upstairs with a sports medicine provider.  Return to the ER for new or worsening symptoms or any other concerns that you may have.

## 2018-08-22 NOTE — ED Provider Notes (Signed)
[facility] EMERGENCY DEPARTMENT Provider Note   CSN: [id number] Arrival date & time: 08/22/18  1702     History   Chief Complaint Chief Complaint  Patient presents with  . Toe Pain    HPI Charles Meyers is a 37 y.o. male with a history of tobacco abuse and alcohol abuse who presents to the emergency department with multiple complaints today including R ear pain, L foot pain, and back pain.   R ear pain- Onset 2-3 months ago, worsening over past 1-2 weeks. States pain is severe, radiating to the R side of the head. Reports associated popping sensation in that ear. Denies associated fever, chills, congestion, sore throat, or ear drainage.  L foot pain- Onset 3 days ago s/p injury. Patient was ambulating and the 3rd-5th toes were stubbed, hyperplantarflexed, and resulted in a fall. No head injury or LOC. No other sustained injuries. Pain to the 3rd-5th digits and to the forefoot laterally. Pain is severe, worse with movement, no alleviating factors. Denies numbness or weakness.   Back pain- chronic, ongoing for several years. Located in the mid to lower back, L side > R side. No acute changes. No traumatic injuries including his fall with toe injury. Denies numbness, tingling, weakness, incontinence to bowel/bladder, fever, chills, IV drug use, or hx of cancer.    HPI  History reviewed. No pertinent past medical history.  Patient Active Problem List   Diagnosis Date Noted  . Alcohol withdrawal (HCC) 10/20/2015  . Alcohol use disorder, severe, dependence (HCC) 10/20/2015  . Tobacco use disorder 10/20/2015  . 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 URETHRAL DILATION          Home Medications    Prior to Admission medications   Medication Sig Start Date End Date Taking? Authorizing Provider  benzonatate (TESSALON) 100 MG  capsule Take 1 capsule (100 mg total) by mouth every 8 (eight) hours. 11/30/16   Melene Plan, DO  chlordiazePOXIDE (LIBRIUM) 25 MG capsule Take 1 capsule (25 mg total) by mouth 3 (three) times daily as needed for anxiety. 10/20/15   Jimmy Footman, MD  hydrOXYzine (ATARAX/VISTARIL) 50 MG tablet Take 1 tablet (50 mg total) by mouth at bedtime. 10/20/15   Jimmy Footman, MD    Family History No family history on file.  Social History Social History   Tobacco Use  . Smoking status: Current Every Day Smoker    Types: Cigarettes  . Smokeless tobacco: Never Used  Substance Use Topics  . Alcohol use: Not Currently    Frequency: Never  . Drug use: Not Currently     Allergies   Ciprofloxacin and Morphine and related   Review of Systems Review of Systems  Constitutional: Negative for chills and fever.  HENT: Positive for ear pain and hearing loss (Popping sensation, not loss). Negative for congestion, ear discharge, rhinorrhea, sore throat, trouble swallowing and voice change.   Respiratory: Negative for cough and shortness of breath.   Musculoskeletal: Positive for back pain.       Positive for L foot pain.   Neurological: Negative for weakness and numbness.    Physical Exam Updated Vital Signs BP (!) 139/91 (BP Location: Left Arm)   Pulse 96   Temp 98.3 F (36.8 C) (Oral)   Resp 18   Ht 5\' 9"  (1.753 m)   Wt 72.6 kg   SpO2 100%  BMI 23.63 kg/m   Physical Exam  Constitutional: He appears well-developed and well-nourished.  Non-toxic appearance. No distress.  HENT:  Head: Normocephalic and atraumatic.  Right Ear: Ear canal normal. No drainage or swelling. Tympanic membrane is erythematous and bulging (with loss of landmarks). Tympanic membrane is not perforated.  Left Ear: Ear canal normal. No drainage or swelling. Tympanic membrane is not perforated and not erythematous.  Nose: Nose normal.  Mouth/Throat: Uvula is midline and oropharynx is clear and  moist. No oropharyngeal exudate or posterior oropharyngeal erythema.  No mastoid erythema/tendenress/swelling.   Eyes: Pupils are equal, round, and reactive to light. Conjunctivae and EOM are normal. Right eye exhibits no discharge. Left eye exhibits no discharge.  Neck: Normal range of motion. Neck supple. No spinous process tenderness present. No neck rigidity. No edema and no erythema present.  Cardiovascular: Normal rate and regular rhythm.  No murmur heard. Pulses:      Popliteal pulses are 2+ on the right side, and 2+ on the left side.       Dorsalis pedis pulses are 2+ on the right side, and 2+ on the left side.  Pulmonary/Chest: Effort normal and breath sounds normal. No respiratory distress. He has no wheezes. He has no rhonchi. He has no rales.  Respiration even and unlabored  Abdominal: Soft. He exhibits no distension. There is no tenderness.  Musculoskeletal:  No obvious deformities, appreciable swelling, erythema, ecchymosis, or increased warmth. No significant appreciable open wounds.  Upper extremities: Normal ROM. Nontender Back: Diffuse tenderness to thoracic and lumbar region, L>R. No point/focal vertebral tenderness. No palpable step off.  Lower extremities: Normal ROM. Tenderness to the L 3rd-5th extending to the mid to latera aspect of the forefoot/midfoot. No point/focal area of increased tenderness.  No navicular or malleolar tenderness. NVI distally    Lymphadenopathy:    He has no cervical adenopathy.  Neurological: He is alert.  Clear speech. Sensation grossly intact x 4. 5/5 symmetric grip strength. Symmetric 5/5 strength to bilateral knees with extension/flexion. Ambulatory.   Skin: Skin is warm and dry. Capillary refill takes less than 2 seconds. No rash noted.  Psychiatric: He has a normal mood and affect. His behavior is normal. Thought content normal.  Nursing note and vitals reviewed.    ED Treatments / Results  Labs (all labs ordered are listed, but  only abnormal results are displayed) Labs Reviewed - No data to display  EKG None  Radiology No results found.  Procedures Procedures (including critical care time)  Medications Ordered in ED Medications  ibuprofen (ADVIL,MOTRIN) tablet 800 mg (800 mg Oral Given 08/22/18 1814)     Initial Impression / Assessment and Plan / ED Course  I have reviewed the triage vital signs and the nursing notes.  Pertinent labs & imaging results that were available during my care of the patient were reviewed by me and considered in my medical decision making (see chart for details).   Patient presents to the ED with multiple complaints today.  Nontoxic-appearing, in no apparent distress, vitals WNL with the exception of initially elevated BP, doubt HTN emergency, normalized on repeat vitals. - Regarding R ear pain: TM appears consistent with acute otitis media, bulging, erythematous, loss of landmarks.  TM intact, does not appear perforated. No physical exam findings to raise concern for mastoiditis, meningitis, cellulitis, or acute otitis externa.  Will treat with amoxicillin. - Regarding L foot pain: X-ray negative for fracture dislocation, neurovascularly intact distally.  Recommended PRICE.  - Regarding  back pain: Chronic.  Atraumatic. Normal neurologic exam, no point/focal midline tenderness to palpation. He is ambulatory in the ED.  No back pain red flags. No urinary sxs.   Will treat patient's pain with Naproxen and Robaxin, discussed with patient that they are not to drive or operate heavy machinery while taking Robaxin. Prescription provided for Amoxicillin as well. I discussed treatment plan, need for PCP follow-up, and return precautions with the patient. Provided opportunity for questions, patient confirmed understanding and is in agreement with plan.   Final Clinical Impressions(s) / ED Diagnoses   Final diagnoses:  Acute otitis media, unspecified otitis media type  Chronic bilateral low  back pain without sciatica  Injury of left foot, initial encounter    ED Discharge Orders         Ordered    amoxicillin (AMOXIL) 500 MG capsule  3 times daily     08/22/18 1952    naproxen (NAPROSYN) 500 MG tablet  2 times daily     08/22/18 1952    methocarbamol (ROBAXIN) 500 MG tablet  Every 8 hours PRN     08/22/18 1952           Camrin Lapre, Pleas Koch, PA-C 08/22/18 2033    Gwyneth Sprout, MD 08/22/18 2351

## 2018-09-24 ENCOUNTER — Encounter (HOSPITAL_BASED_OUTPATIENT_CLINIC_OR_DEPARTMENT_OTHER): Payer: Self-pay | Admitting: Adult Health

## 2018-09-24 ENCOUNTER — Other Ambulatory Visit: Payer: Self-pay

## 2018-09-24 ENCOUNTER — Emergency Department (HOSPITAL_BASED_OUTPATIENT_CLINIC_OR_DEPARTMENT_OTHER)
Admission: EM | Admit: 2018-09-24 | Discharge: 2018-09-24 | Disposition: A | Discharge status: Home / Self Care | Payer: Self-pay | Attending: Emergency Medicine | Admitting: Emergency Medicine

## 2018-09-24 DIAGNOSIS — Z79899 Other long term (current) drug therapy: Secondary | ICD-10-CM | POA: Insufficient documentation

## 2018-09-24 DIAGNOSIS — H9201 Otalgia, right ear: Secondary | ICD-10-CM | POA: Insufficient documentation

## 2018-09-24 DIAGNOSIS — H81399 Other peripheral vertigo, unspecified ear: Secondary | ICD-10-CM | POA: Insufficient documentation

## 2018-09-24 DIAGNOSIS — H6692 Otitis media, unspecified, left ear: Secondary | ICD-10-CM | POA: Insufficient documentation

## 2018-09-24 DIAGNOSIS — F17210 Nicotine dependence, cigarettes, uncomplicated: Secondary | ICD-10-CM | POA: Insufficient documentation

## 2018-09-24 DIAGNOSIS — R51 Headache: Secondary | ICD-10-CM | POA: Insufficient documentation

## 2018-09-24 LAB — BASIC METABOLIC PANEL
Anion gap: 9 (ref 5–15)
BUN: 7 mg/dL (ref 6–20)
CHLORIDE: 100 mmol/L (ref 98–111)
CO2: 30 mmol/L (ref 22–32)
Calcium: 9.4 mg/dL (ref 8.9–10.3)
Creatinine, Ser: 0.95 mg/dL (ref 0.61–1.24)
GFR calc Af Amer: >60 mL/min (ref >60)
GLUCOSE: 96 mg/dL (ref 70–99)
POTASSIUM: 3.8 mmol/L (ref 3.5–5.1)
Sodium: 139 mmol/L (ref 135–145)

## 2018-09-24 LAB — CBC
HCT: 42.4 % (ref 39.0–52.0)
HEMOGLOBIN: 14.3 g/dL (ref 13.0–17.0)
MCH: 33.6 pg (ref 26.0–34.0)
MCHC: 33.7 g/dL (ref 30.0–36.0)
MCV: 99.5 fL (ref 80.0–100.0)
NRBC: 0 % (ref 0.0–0.2)
PLATELETS: 285 10*3/uL (ref 150–400)
RBC: 4.26 MIL/uL (ref 4.22–5.81)
RDW: 12.9 % (ref 11.5–15.5)
WBC: 10.6 10*3/uL — ABNORMAL HIGH (ref 4.0–10.5)

## 2018-09-24 MED ORDER — AMOXICILLIN-POT CLAVULANATE 875-125 MG PO TABS: 1.0000 | ORAL_TABLET | Freq: Two times a day (BID) | ORAL | 0 refills | Status: AC

## 2018-09-24 MED ORDER — METOCLOPRAMIDE HCL 5 MG/ML IJ SOLN
10.0000 mg | Freq: Once | INTRAMUSCULAR | Status: AC
  Administered 2018-09-24: 10 mg via INTRAVENOUS
  Filled 2018-09-24: qty 2

## 2018-09-24 MED ORDER — FLUTICASONE PROPIONATE 50 MCG/ACT NA SUSP: 2.0000 | Freq: Every day | NASAL | 0 refills | Status: DC

## 2018-09-24 MED ORDER — KETOROLAC TROMETHAMINE 15 MG/ML IJ SOLN
15.0000 mg | Freq: Once | INTRAMUSCULAR | Status: AC
  Administered 2018-09-24: 15 mg via INTRAVENOUS
  Filled 2018-09-24: qty 1

## 2018-09-24 MED ORDER — DIPHENHYDRAMINE HCL 50 MG/ML IJ SOLN
12.5000 mg | Freq: Once | INTRAMUSCULAR | Status: AC
  Administered 2018-09-24: 12.5 mg via INTRAVENOUS
  Filled 2018-09-24: qty 1

## 2018-09-24 MED ORDER — MECLIZINE HCL 12.5 MG PO TABS: 12.5000 mg | ORAL_TABLET | Freq: Three times a day (TID) | ORAL | 0 refills | Status: AC | PRN

## 2018-09-24 MED ORDER — SODIUM CHLORIDE 0.9 % IV BOLUS
1000.0000 mL | Freq: Once | INTRAVENOUS | Status: AC
  Administered 2018-09-24: 1000 mL via INTRAVENOUS

## 2018-09-24 NOTE — ED Provider Notes (Addendum)
[facility] EMERGENCY DEPARTMENT Provider Note   CSN: [id number] Arrival date & time: 09/24/18  1642     History   Chief Complaint Chief Complaint  Patient presents with  . Otalgia  . Dizziness    HPI Charles Meyers is a 37 y.o. male.  HPI   Patient is a 36 year old male who presents emergency department today complaining of right ear pain and vertigo.  Patient has had right ear pain for the last month.  States he was seen in the ED 08/22/2018 diagnosed with an ear infection.  He was started on amoxicillin but his pain is not improved.  Has pain to anterior to the right ear.  Also feels a pressure to the right ear.  Has mild left ear pain as well.  No dental pain fevers or chills.  Has had some rhinorrhea nasal congestion and sinus pressure.  Has not taken any medications for this.  He smokes tobacco.  He also reports intermittent episodes of vertigo for the last 3 weeks.  Symptoms occur only with movement.  Symptoms are exacerbated most when looking to the left and looking up.  Since last for seconds at a time and resolve on their own.  Not associated with nausea and vomiting.  He has had intermittent headaches for the same time period.  Denies worst headache of life.  No lightheadedness, vision changes, numbness or weakness to the arms or legs.  Mild photophobia.  History reviewed. No pertinent past medical history.  Patient Active Problem List   Diagnosis Date Noted  . Alcohol withdrawal (HCC) 10/20/2015  . Alcohol use disorder, severe, dependence (HCC) 10/20/2015  . Tobacco use disorder 10/20/2015  . 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 URETHRAL DILATION          Home Medications    Prior to Admission medications   Medication Sig Start Date End Date Taking? Authorizing Provider  amoxicillin (AMOXIL) 500 MG capsule  Take 1 capsule (500 mg total) by mouth 3 (three) times daily. 08/22/18   Petrucelli, Samantha R, PA-C  amoxicillin-clavulanate (AUGMENTIN) 875-125 MG tablet Take 1 tablet by mouth every 12 (twelve) hours for 7 days. 09/24/18 10/01/18  Jordani Nunn S, PA-C  benzonatate (TESSALON) 100 MG capsule Take 1 capsule (100 mg total) by mouth every 8 (eight) hours. 11/30/16   Melene Plan, DO  chlordiazePOXIDE (LIBRIUM) 25 MG capsule Take 1 capsule (25 mg total) by mouth 3 (three) times daily as needed for anxiety. 10/20/15   Jimmy Footman, MD  fluticasone (FLONASE) 50 MCG/ACT nasal spray Place 2 sprays into both nostrils daily. 09/24/18   Mercede Rollo S, PA-C  hydrOXYzine (ATARAX/VISTARIL) 50 MG tablet Take 1 tablet (50 mg total) by mouth at bedtime. 10/20/15   Jimmy Footman, MD  meclizine (ANTIVERT) 12.5 MG tablet Take 1 tablet (12.5 mg total) by mouth 3 (three) times daily as needed for up to 3 days for dizziness. 09/24/18 09/27/18  Derik Fults S, PA-C  methocarbamol (ROBAXIN) 500 MG tablet Take 1 tablet (500 mg total) by mouth every 8 (eight) hours as needed. 08/22/18   Petrucelli, Samantha R, PA-C  naproxen (NAPROSYN) 500 MG tablet Take 1 tablet (500 mg total) by mouth 2 (two) times daily. 08/22/18   Petrucelli, Pleas Koch, PA-C    Family History History reviewed. No pertinent family history.  Social History Social History   Tobacco Use  .  Smoking status: Current Every Day Smoker    Types: Cigarettes  . Smokeless tobacco: Never Used  Substance Use Topics  . Alcohol use: Not Currently    Frequency: Never  . Drug use: Not Currently     Allergies   Ciprofloxacin and Morphine and related   Review of Systems Review of Systems  Constitutional: Negative for chills and fever.  HENT: Positive for congestion, ear pain, rhinorrhea and sore throat.        Ear fullness  Eyes: Positive for photophobia. Negative for visual disturbance.  Respiratory: Negative for  shortness of breath.   Cardiovascular: Negative for chest pain.  Gastrointestinal: Negative for abdominal pain, constipation, diarrhea, nausea and vomiting.  Genitourinary: Negative for dysuria, hematuria and urgency.  Musculoskeletal: Negative for back pain.  Skin: Negative for rash.  Neurological: Positive for dizziness and headaches. Negative for weakness, light-headedness and numbness.   Physical Exam Updated Vital Signs BP 114/66 (BP Location: Right Arm)   Pulse 67   Temp 98.2 F (36.8 C) (Oral)   Resp 18   Ht 5\' 9"  (1.753 m)   Wt 72.6 kg   SpO2 100%   BMI 23.63 kg/m   Physical Exam  Constitutional: He appears well-developed and well-nourished.  HENT:  Head: Normocephalic and atraumatic.  Right external ear canal and TM are within normal limits.  Left TM is erythematous and bulging. Mild TTP anterior to the left ear along TMJ. No mastoid TTP, erythema or swelling. No significant erythema, tonsillar swelling or tonsillar exudates.  Eyes: Pupils are equal, round, and reactive to light. Conjunctivae and EOM are normal.  Neck: Neck supple.  Cardiovascular: Normal rate, regular rhythm and normal heart sounds.  No murmur heard. Pulmonary/Chest: Effort normal and breath sounds normal. No respiratory distress. He has no wheezes.  Abdominal: Soft. Bowel sounds are normal. He exhibits no distension. There is no tenderness. There is no guarding.  Musculoskeletal: He exhibits no edema.  Lymphadenopathy:    He has cervical adenopathy.  Neurological: He is alert.  Mental Status:  Alert, thought content appropriate, able to give a coherent history. Speech fluent without evidence of aphasia. Able to follow 2 step commands without difficulty.  Cranial Nerves:  II:  pupils equal, round, reactive to light III,IV, VI: ptosis not present, extra-ocular motions intact bilaterally  V,VII: smile symmetric, facial light touch sensation equal VIII: hearing grossly normal to voice  X: uvula  elevates symmetrically  XI: bilateral shoulder shrug symmetric and strong XII: midline tongue extension without fassiculations Motor:  Normal tone. 5/5 strength of BUE and BLE major muscle groups including strong and equal grip strength and dorsiflexion/plantar flexion Sensory: light touch normal in all extremities. Cerebellar: normal finger-to-nose with bilateral upper extremities Gait: normal gait and balance.  Negative romberg  Skin: Skin is warm and dry.  Psychiatric: He has a normal mood and affect.  Nursing note and vitals reviewed.  ED Treatments / Results  Labs (all labs ordered are listed, but only abnormal results are displayed) Labs Reviewed  CBC - Abnormal; Notable for the following components:      Result Value   WBC 10.6 (*)    All other components within normal limits  BASIC METABOLIC PANEL    EKG None  Radiology No results found.  Procedures Procedures (including critical care time)  Medications Ordered in ED Medications  ketorolac (TORADOL) 15 MG/ML injection 15 mg (15 mg Intravenous Given 09/24/18 1822)  metoCLOPramide (REGLAN) injection 10 mg (10 mg Intravenous Given 09/24/18 1821)  diphenhydrAMINE (BENADRYL) injection 12.5 mg (12.5 mg Intravenous Given 09/24/18 1822)  sodium chloride 0.9 % bolus 1,000 mL (0 mLs Intravenous Stopped 09/24/18 1853)     Initial Impression / Assessment and Plan / ED Course  I have reviewed the triage vital signs and the nursing notes.  Pertinent labs & imaging results that were available during my care of the patient were reviewed by me and considered in my medical decision making (see chart for details).    Final Clinical Impressions(s) / ED Diagnoses   Final diagnoses:  Peripheral vertigo, unspecified laterality  Left otitis media, unspecified otitis media type  Right ear pain   Patient with intermittent episodes of vertigo that last seconds at a time.  Episodes are associated with ear pain, ear fullness, nasal  congestion and sinus pressure.  Also has headache, not worse headache of life.  Normal neurologic exam.  No vertical or rotational nystagmus. Patient normal finger-nose and normal gait.  Good of Romberg.  No slurred speech or weakness.  Doubt CVA or other central cause of vertigo.  History and physical consistent with peripheral vertigo symptoms.  We'll discharge home with meclizine. Patient instructed to followup with her primary care physician or ear nose and throat within 3 days for further evaluation. They are to return to the emergency department for new neurologic symptoms, loss of vision or other concerning symptoms.  She and wife at bedside voiced understanding plan reasons to return immediately to the ED.  All questions answered.   ED Discharge Orders         Ordered    amoxicillin-clavulanate (AUGMENTIN) 875-125 MG tablet  Every 12 hours     09/24/18 1949    fluticasone (FLONASE) 50 MCG/ACT nasal spray  Daily     09/24/18 1949    meclizine (ANTIVERT) 12.5 MG tablet  3 times daily PRN     09/24/18 1949           Cabela Pacifico, [employer], PA-C 09/24/18 2000    Makynleigh Breslin S, PA-C 09/24/18 2001    Doug Sou, MD 09/24/18 2355

## 2018-09-24 NOTE — ED Triage Notes (Addendum)
PResents with 5 months of right ear pain. He now reports vertigo that began Tuesday and is causing him inability to drive and work. Nothing makes the vertigo better. MOvement makes the vertigo worse. He has a prior history of the same.  Dramamine and Benadryl are not helping.

## 2018-09-24 NOTE — Discharge Instructions (Addendum)
Please take medications as directed.   You were give a prescription for Meclizine. Do not operate machinery, drive a car, or work while taking this medication as it can make you drowsy.   You were given a referral to the ear, nose, and throat doctor.  Please call the office to make an appointment for follow-up.   You were also given information to follow-up with a primary care doctor.  Please call this office to make an appointment or call the number on your discharge paperwork to establish care with a doctor.  Return to the emergency department for any worsening symptoms including severe headaches, vision changes, persistent dizziness.

## 2019-02-15 ENCOUNTER — Other Ambulatory Visit: Payer: Self-pay

## 2019-02-15 ENCOUNTER — Encounter (HOSPITAL_BASED_OUTPATIENT_CLINIC_OR_DEPARTMENT_OTHER): Payer: Self-pay | Admitting: Emergency Medicine

## 2019-02-15 ENCOUNTER — Emergency Department (HOSPITAL_BASED_OUTPATIENT_CLINIC_OR_DEPARTMENT_OTHER): Payer: Self-pay

## 2019-02-15 ENCOUNTER — Emergency Department (HOSPITAL_BASED_OUTPATIENT_CLINIC_OR_DEPARTMENT_OTHER)
Admission: EM | Admit: 2019-02-15 | Discharge: 2019-02-15 | Disposition: A | Discharge status: Home / Self Care | Payer: Self-pay | Attending: Emergency Medicine | Admitting: Emergency Medicine

## 2019-02-15 DIAGNOSIS — F1020 Alcohol dependence, uncomplicated: Secondary | ICD-10-CM | POA: Insufficient documentation

## 2019-02-15 DIAGNOSIS — Z79899 Other long term (current) drug therapy: Secondary | ICD-10-CM | POA: Insufficient documentation

## 2019-02-15 DIAGNOSIS — F419 Anxiety disorder, unspecified: Secondary | ICD-10-CM | POA: Insufficient documentation

## 2019-02-15 DIAGNOSIS — F17210 Nicotine dependence, cigarettes, uncomplicated: Secondary | ICD-10-CM | POA: Insufficient documentation

## 2019-02-15 DIAGNOSIS — N3090 Cystitis, unspecified without hematuria: Secondary | ICD-10-CM | POA: Insufficient documentation

## 2019-02-15 DIAGNOSIS — J45909 Unspecified asthma, uncomplicated: Secondary | ICD-10-CM | POA: Insufficient documentation

## 2019-02-15 HISTORY — DX: Unspecified urethral stricture, male, unspecified site: N35.919

## 2019-02-15 HISTORY — DX: Anxiety disorder, unspecified: F41.9

## 2019-02-15 HISTORY — DX: Unspecified asthma, uncomplicated: J45.909

## 2019-02-15 LAB — CBC WITH DIFFERENTIAL/PLATELET
Abs Immature Granulocytes: 0.04 10*3/uL (ref 0.00–0.07)
BASOS ABS: 0.1 10*3/uL (ref 0.0–0.1)
Basophils Relative: 0 %
Eosinophils Absolute: 0.4 10*3/uL (ref 0.0–0.5)
Eosinophils Relative: 4 %
HCT: 41.9 % (ref 39.0–52.0)
HEMOGLOBIN: 14.6 g/dL (ref 13.0–17.0)
Immature Granulocytes: 0 %
LYMPHS ABS: 3 10*3/uL (ref 0.7–4.0)
Lymphocytes Relative: 26 %
MCH: 33.8 pg (ref 26.0–34.0)
MCHC: 34.8 g/dL (ref 30.0–36.0)
MCV: 97 fL (ref 80.0–100.0)
MONOS PCT: 8 %
Monocytes Absolute: 0.9 10*3/uL (ref 0.1–1.0)
NEUTROS PCT: 62 %
NRBC: 0 % (ref 0.0–0.2)
Neutro Abs: 7.2 10*3/uL (ref 1.7–7.7)
Platelets: 343 10*3/uL (ref 150–400)
RBC: 4.32 MIL/uL (ref 4.22–5.81)
RDW: 12.7 % (ref 11.5–15.5)
WBC: 11.7 10*3/uL — ABNORMAL HIGH (ref 4.0–10.5)

## 2019-02-15 LAB — BASIC METABOLIC PANEL
Anion gap: 8 (ref 5–15)
BUN: 14 mg/dL (ref 6–20)
CALCIUM: 9 mg/dL (ref 8.9–10.3)
CO2: 27 mmol/L (ref 22–32)
CREATININE: 0.75 mg/dL (ref 0.61–1.24)
Chloride: 101 mmol/L (ref 98–111)
GFR calc Af Amer: >60 mL/min (ref >60)
GFR calc non Af Amer: >60 mL/min (ref >60)
Glucose, Bld: 125 mg/dL — ABNORMAL HIGH (ref 70–99)
Potassium: 3.9 mmol/L (ref 3.5–5.1)
SODIUM: 136 mmol/L (ref 135–145)

## 2019-02-15 LAB — URINALYSIS, ROUTINE W REFLEX MICROSCOPIC
Bilirubin Urine: NEGATIVE
GLUCOSE, UA: NEGATIVE mg/dL
Hgb urine dipstick: NEGATIVE
Ketones, ur: NEGATIVE mg/dL
NITRITE: NEGATIVE
PH: 7 (ref 5.0–8.0)
Protein, ur: NEGATIVE mg/dL
Specific Gravity, Urine: 1.015 (ref 1.005–1.030)

## 2019-02-15 LAB — HEPATIC FUNCTION PANEL
ALT: 17 U/L (ref 0–44)
AST: 18 U/L (ref 15–41)
Albumin: 4.3 g/dL (ref 3.5–5.0)
Alkaline Phosphatase: 56 U/L (ref 38–126)
BILIRUBIN DIRECT: 0.1 mg/dL (ref 0.0–0.2)
BILIRUBIN INDIRECT: 0.4 mg/dL (ref 0.3–0.9)
TOTAL PROTEIN: 6.9 g/dL (ref 6.5–8.1)
Total Bilirubin: 0.5 mg/dL (ref 0.3–1.2)

## 2019-02-15 LAB — URINALYSIS, MICROSCOPIC (REFLEX): RBC / HPF: NONE SEEN RBC/hpf (ref 0–5)

## 2019-02-15 MED ORDER — SODIUM CHLORIDE 0.9 % IV BOLUS
1000.0000 mL | Freq: Once | INTRAVENOUS | Status: AC
  Administered 2019-02-15: 1000 mL via INTRAVENOUS

## 2019-02-15 MED ORDER — ONDANSETRON HCL 4 MG/2ML IJ SOLN
4.0000 mg | Freq: Once | INTRAMUSCULAR | Status: AC
  Administered 2019-02-15: 4 mg via INTRAVENOUS
  Filled 2019-02-15: qty 2

## 2019-02-15 MED ORDER — OXYBUTYNIN CHLORIDE 5 MG PO TABS
5.0000 mg | ORAL_TABLET | Freq: Once | ORAL | Status: DC
  Filled 2019-02-15: qty 1

## 2019-02-15 MED ORDER — CEPHALEXIN 500 MG PO CAPS: 500.0000 mg | ORAL_CAPSULE | Freq: Two times a day (BID) | ORAL | 0 refills | Status: AC

## 2019-02-15 MED ORDER — FENTANYL CITRATE (PF) 100 MCG/2ML IJ SOLN
50.0000 ug | Freq: Once | INTRAMUSCULAR | Status: AC
  Administered 2019-02-15: 50 ug via INTRAVENOUS
  Filled 2019-02-15: qty 2

## 2019-02-15 MED ORDER — HYDROCODONE-ACETAMINOPHEN 5-325 MG PO TABS: 1.0000 | ORAL_TABLET | Freq: Four times a day (QID) | ORAL | 0 refills | Status: DC | PRN

## 2019-02-15 MED ORDER — OXYBUTYNIN CHLORIDE 5 MG PO TABS: 5.0000 mg | ORAL_TABLET | Freq: Three times a day (TID) | ORAL | 0 refills | Status: AC

## 2019-02-15 NOTE — ED Provider Notes (Signed)
[facility] EMERGENCY DEPARTMENT Provider Note   CSN: [id number] Arrival date & time: 02/15/19  1820    History   Chief Complaint Chief Complaint  Patient presents with  . Flank Pain    HPI Charles Meyers is a 38 y.o. male.     The history is provided by the patient.  Male GU Problem  Presenting symptoms: dysuria   Relieved by:  Nothing Worsened by:  Nothing Associated symptoms: flank pain, urinary frequency and urinary retention (difficulty urinating, hx of strictures)   Associated symptoms: no abdominal pain, no diarrhea, no fever, no genital itching, no genital lesions, no genital rash, no groin pain, no hematuria, no nausea, no penile redness, no penile swelling, no priapism, no scrotal swelling, no urinary hesitation, no urinary incontinence and no vomiting   Risk factors comment:  Urethral stricture history   Past Medical History:  Diagnosis Date  . Anxiety   . Asthma   . Urethral stricture in male     Patient Active Problem List   Diagnosis Date Noted  . Alcohol withdrawal (HCC) 10/20/2015  . Alcohol use disorder, severe, dependence (HCC) 10/20/2015  . Tobacco use disorder 10/20/2015  . 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 URETHRAL DILATION          Home Medications    Prior to Admission medications   Medication Sig Start Date End Date Taking? Authorizing Provider  amoxicillin (AMOXIL) 500 MG capsule Take 1 capsule (500 mg total) by mouth 3 (three) times daily. 08/22/18   Petrucelli, Samantha R, PA-C  benzonatate (TESSALON) 100 MG capsule Take 1 capsule (100 mg total) by mouth every 8 (eight) hours. 11/30/16   Melene Plan, DO  cephALEXin (KEFLEX) 500 MG capsule Take 1 capsule (500 mg total) by mouth 2 (two) times daily for 7 days. 02/15/19 02/22/19  Kattia Selley, DO  chlordiazePOXIDE (LIBRIUM) 25 MG  capsule Take 1 capsule (25 mg total) by mouth 3 (three) times daily as needed for anxiety. 10/20/15   Jimmy Footman, MD  fluticasone (FLONASE) 50 MCG/ACT nasal spray Place 2 sprays into both nostrils daily. 09/24/18   Couture, Cortni S, PA-C  HYDROcodone-acetaminophen (NORCO/VICODIN) 5-325 MG tablet Take 1 tablet by mouth every 6 (six) hours as needed for up to 10 doses. 02/15/19   Ashna Dorough, DO  hydrOXYzine (ATARAX/VISTARIL) 50 MG tablet Take 1 tablet (50 mg total) by mouth at bedtime. 10/20/15   Jimmy Footman, MD  methocarbamol (ROBAXIN) 500 MG tablet Take 1 tablet (500 mg total) by mouth every 8 (eight) hours as needed. 08/22/18   Petrucelli, Samantha R, PA-C  naproxen (NAPROSYN) 500 MG tablet Take 1 tablet (500 mg total) by mouth 2 (two) times daily. 08/22/18   Petrucelli, Samantha R, PA-C  oxybutynin (DITROPAN) 5 MG tablet Take 1 tablet (5 mg total) by mouth 3 (three) times daily for 20 days. 02/15/19 03/07/19  Virgina Norfolk, DO    Family History No family history on file.  Social History Social History   Tobacco Use  . Smoking status: Current Every Day Smoker    Packs/day: 0.50    Types: Cigarettes  . Smokeless tobacco: Never Used  Substance Use Topics  . Alcohol use: Not Currently    Frequency: Never  . Drug use: Not Currently     Allergies   Ciprofloxacin and Morphine and related   Review of Systems Review of  Systems  Constitutional: Negative for chills and fever.  HENT: Negative for ear pain and sore throat.   Eyes: Negative for pain and visual disturbance.  Respiratory: Negative for cough and shortness of breath.   Cardiovascular: Negative for chest pain and palpitations.  Gastrointestinal: Negative for abdominal pain, diarrhea, nausea and vomiting.  Genitourinary: Positive for dysuria, flank pain and frequency. Negative for bladder incontinence, hematuria, hesitancy, penile swelling and scrotal swelling.  Musculoskeletal: Negative for  arthralgias and back pain.  Skin: Negative for color change and rash.  Neurological: Negative for seizures and syncope.  All other systems reviewed and are negative.    Physical Exam Updated Vital Signs  ED Triage Vitals  Enc Vitals Group     BP 02/15/19 1827 132/87     Pulse Rate 02/15/19 1827 96     Resp 02/15/19 1827 20     Temp 02/15/19 1827 98.1 F (36.7 C)     Temp Source 02/15/19 1827 Oral     SpO2 02/15/19 1827 100 %     Weight 02/15/19 1827 152 lb (68.9 kg)     Height 02/15/19 1827 5\' 9"  (1.753 m)     Head Circumference --      Peak Flow --      Pain Score 02/15/19 1832 8     Pain Loc --      Pain Edu? --      Excl. in GC? --     Physical Exam Vitals signs and nursing note reviewed.  Constitutional:      General: He is not in acute distress.    Appearance: He is well-developed. He is not ill-appearing.  HENT:     Head: Normocephalic and atraumatic.     Nose: Nose normal.     Mouth/Throat:     Mouth: Mucous membranes are moist.  Eyes:     Extraocular Movements: Extraocular movements intact.     Conjunctiva/sclera: Conjunctivae normal.     Pupils: Pupils are equal, round, and reactive to light.  Neck:     Musculoskeletal: Neck supple.  Cardiovascular:     Rate and Rhythm: Normal rate and regular rhythm.     Pulses: Normal pulses.     Heart sounds: Normal heart sounds. No murmur.  Pulmonary:     Effort: Pulmonary effort is normal. No respiratory distress.     Breath sounds: Normal breath sounds.  Abdominal:     General: There is no distension.     Palpations: Abdomen is soft.     Tenderness: There is abdominal tenderness (suprapubic). There is no guarding or rebound.     Comments: No CVA tenderness  Genitourinary:    Penis: Normal.      Scrotum/Testes: Normal.     Comments: Meatus patent with some scarring around meatus Musculoskeletal: Normal range of motion.        General: No swelling or tenderness.  Skin:    General: Skin is warm and dry.      Capillary Refill: Capillary refill takes less than 2 seconds.  Neurological:     General: No focal deficit present.     Mental Status: He is alert.  Psychiatric:        Mood and Affect: Mood normal.      ED Treatments / Results  Labs (all labs ordered are listed, but only abnormal results are displayed) Labs Reviewed  URINALYSIS, ROUTINE W REFLEX MICROSCOPIC - Abnormal; Notable for the following components:      Result Value  Leukocytes,Ua SMALL (*)    All other components within normal limits  CBC WITH DIFFERENTIAL/PLATELET - Abnormal; Notable for the following components:   WBC 11.7 (*)    All other components within normal limits  BASIC METABOLIC PANEL - Abnormal; Notable for the following components:   Glucose, Bld 125 (*)    All other components within normal limits  URINALYSIS, MICROSCOPIC (REFLEX) - Abnormal; Notable for the following components:   Bacteria, UA RARE (*)    All other components within normal limits  URINE CULTURE  HEPATIC FUNCTION PANEL    EKG None  Radiology Ct Renal Stone Study  Result Date: 02/15/2019 CLINICAL DATA:  Low back pain and left groin pain EXAM: CT ABDOMEN AND PELVIS WITHOUT CONTRAST TECHNIQUE: Multidetector CT imaging of the abdomen and pelvis was performed following the standard protocol without IV contrast. COMPARISON:  None. FINDINGS: Lower chest: Lung bases demonstrate no acute consolidation or effusion. The heart size is normal. Hepatobiliary: No focal liver abnormality is seen. No gallstones, gallbladder wall thickening, or biliary dilatation. Pancreas: Unremarkable. No pancreatic ductal dilatation or surrounding inflammatory changes. Spleen: Normal in size without focal abnormality. Adrenals/Urinary Tract: Adrenal glands are unremarkable. Kidneys are normal, without renal calculi, focal lesion, or hydronephrosis. Bladder is slightly thick walled Stomach/Bowel: Stomach is within normal limits. Appendix appears normal. No evidence of  bowel wall thickening, distention, or inflammatory changes. Vascular/Lymphatic: No significant vascular findings are present. No enlarged abdominal or pelvic lymph nodes. Reproductive: Prostate calcifications. Other: Negative for free air or free fluid. Musculoskeletal: No acute or significant osseous findings. IMPRESSION: 1. Negative for hydronephrosis or ureteral stone. 2. Slightly thick-walled appearance of the bladder, possible cystitis Electronically Signed   By: Jasmine Pang M.D.   On: 02/15/2019 19:53    Procedures Procedures (including critical care time)  Medications Ordered in ED Medications  sodium chloride 0.9 % bolus 1,000 mL (1,000 mLs Intravenous New Bag/Given 02/15/19 1912)  fentaNYL (SUBLIMAZE) injection 50 mcg (50 mcg Intravenous Given 02/15/19 1921)  ondansetron (ZOFRAN) injection 4 mg (4 mg Intravenous Given 02/15/19 1920)     Initial Impression / Assessment and Plan / ED Course  I have reviewed the triage vital signs and the nursing notes.  Pertinent labs & imaging results that were available during my care of the patient were reviewed by me and considered in my medical decision making (see chart for details).     Charles Meyers is a 38 year old male with history of urethral stricture who presents to the ED with difficulty with urination.  Patient with normal vitals.  No fever.  Patient has had this issue for the last several years.  Worse over the last several weeks.  Has not been able to follow-up with urology due due to insurance issues.  He does not want to see his urologist that he has formally seen at [facility] and would like a new urologist.  Overall his main issue is pain with urination and difficulty with urination.  He feels as if he has another stricture.  Patient was able to urinate without any issues.  Bladder scan showed less than 75 cc of urine.  Patient does not have any retention.  CT scan did not show any kidney stone, hydronephrosis.  Patient did have some  bladder wall thickening.  He had overall equivocal urinalysis and therefore will treat with Keflex given H and P.  Had no significant anemia, electrolyte abnormality, kidney injury.  Had mild leukocytosis.  Patient was given IV fluids, IV  fentanyl, IV Zofran with improvement.  Overall no concern for urinary retention at this time.  Likely has ongoing pain from chronic urethral stricture.  Given number to follow-up with [facility] and given information to follow-up with wellness center to establish care. Given return precautions and discharged from ED in good condition.  Patient given prescription for oxybutynin, Norco, Keflex. No active narcotic scripts.  This chart was dictated using voice recognition software.  Despite best efforts to proofread,  errors can occur which can change the documentation meaning.    Final Clinical Impressions(s) / ED Diagnoses   Final diagnoses:  Cystitis    ED Discharge Orders         Ordered    oxybutynin (DITROPAN) 5 MG tablet  3 times daily     02/15/19 2008    HYDROcodone-acetaminophen (NORCO/VICODIN) 5-325 MG tablet  Every 6 hours PRN     02/15/19 2008    cephALEXin (KEFLEX) 500 MG capsule  2 times daily     02/15/19 2008           Virgina Norfolk, DO 02/15/19 2012

## 2019-02-15 NOTE — ED Notes (Signed)
Pt verbalizes understanding of d/c instructions and denies any further needs at this time. 

## 2019-02-15 NOTE — ED Triage Notes (Signed)
Left flank and groin pain x3-4 months.

## 2019-02-15 NOTE — ED Notes (Signed)
Patient transported to CT 

## 2019-02-15 NOTE — ED Triage Notes (Deleted)
Wound to right 5th finger yesterday morning by a fan blade.  Pt sts it is still hurting today and bled earlier today.  Sts he is concerned d/t the swelling and aching.

## 2019-02-17 LAB — URINE CULTURE: Culture: <10000 — AB

## 2019-04-14 ENCOUNTER — Encounter (HOSPITAL_BASED_OUTPATIENT_CLINIC_OR_DEPARTMENT_OTHER): Payer: Self-pay | Admitting: Adult Health

## 2019-04-14 ENCOUNTER — Other Ambulatory Visit: Payer: Self-pay

## 2019-04-14 ENCOUNTER — Emergency Department (HOSPITAL_BASED_OUTPATIENT_CLINIC_OR_DEPARTMENT_OTHER)
Admission: EM | Admit: 2019-04-14 | Discharge: 2019-04-15 | Disposition: A | Discharge status: Home / Self Care | Payer: Self-pay | Attending: Emergency Medicine | Admitting: Emergency Medicine

## 2019-04-14 DIAGNOSIS — G8918 Other acute postprocedural pain: Secondary | ICD-10-CM | POA: Insufficient documentation

## 2019-04-14 NOTE — ED Notes (Signed)
ED Provider at bedside. 

## 2019-04-14 NOTE — ED Triage Notes (Signed)
Presents post op from urethral stricture surgery about a week ago. He reports that his pain is severe in his testicvles, that he is unable to walk due to pain and they gave him a scrotal support but he could not tolerate it. His testilcles are red and swollen. He is very embarrassed.

## 2019-04-15 MED ORDER — CLINDAMYCIN HCL 300 MG PO CAPS: 300.0000 mg | ORAL_CAPSULE | Freq: Four times a day (QID) | ORAL | 0 refills | Status: DC

## 2019-04-15 MED ORDER — CHLORHEXIDINE GLUCONATE 2 % EX PADS: 2.0000 | MEDICATED_PAD | Freq: Two times a day (BID) | CUTANEOUS | 0 refills | Status: AC

## 2019-04-15 MED ORDER — OXYCODONE HCL 5 MG PO TABS
15.0000 mg | ORAL_TABLET | Freq: Once | ORAL | Status: AC
  Administered 2019-04-15: 15 mg via ORAL
  Filled 2019-04-15: qty 3

## 2019-04-15 MED ORDER — WHITE PETROLATUM EX OINT
TOPICAL_OINTMENT | CUTANEOUS | Status: DC | PRN
  Filled 2019-04-15: qty 28.35

## 2019-04-15 MED ORDER — CHLORHEXIDINE GLUCONATE CLOTH 2 % EX PADS
6.0000 | MEDICATED_PAD | Freq: Once | CUTANEOUS | Status: AC
  Administered 2019-04-15: 6 via TOPICAL
  Filled 2019-04-15: qty 6

## 2019-04-15 MED ORDER — OXYCODONE HCL 5 MG PO TABS: 10.0000 mg | ORAL_TABLET | ORAL | 0 refills | Status: DC | PRN

## 2019-04-15 NOTE — ED Notes (Addendum)
Changed patient's foley bag - offered leg bag but patient refused. Sent home with leg bag and regular bag to use at night. Given leg strap.

## 2019-04-15 NOTE — ED Provider Notes (Signed)
Emergency Department Provider Note   I have reviewed the triage vital signs and the nursing notes.   HISTORY  Chief Complaint Post-op Problem   HPI Charles Meyers is a 38 y.o. male who had a urethral reconstruction done about a week ago presents the emerge department there with persistent scrotal swelling and incisional pain.  Patient states he has difficulty walking sitting laying down and multiple other painful issues.  He states he is scared to wipe after bowel movement because he does not want to hurt the incision.  He states his girlfriend looked at the wound and told him it was infected and he might get sepsis.  He has had multiple calls to his urologist every day since the surgery without resolution of his symptoms.  He did not use scrotal support like he was supposed to.  He feels like his left is worse than his right.  No fevers, nausea, vomiting or other systemic symptoms.  On history taken patient is very worried and concerned about possible sequelae.   No other associated or modifying symptoms.    Past Medical History:  Diagnosis Date  . Anxiety   . Asthma   . Urethral stricture in male     Patient Active Problem List   Diagnosis Date Noted  . Alcohol withdrawal (HCC) 10/20/2015  . Alcohol use disorder, severe, dependence (HCC) 10/20/2015  . Tobacco use disorder 10/20/2015  . 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 URETHRAL DILATION      Current Outpatient Rx  . Order #: [id number] Class: Historical Med  . Order #: [id number] Class: Historical Med  . Order #: [id number] Class: Print  . Order #: [id number] Class: Historical Med  . Order #: [id number] Class: Print  . Order #: [id number] Class: Print  . Order #: [id number] Class: Print  . Order #: [id number] Class: Print  . Order #: [id number] Class: Print  . Order #: [id number] Class:  Normal  . Order #: [id number] Class: Print  . Order #: [id number] Class: Print  . Order #: [id number] Class: Print  . Order #: [id number] Class: Historical Med  . Order #: [id number] Class: Print    Allergies Ciprofloxacin and Morphine and related  History reviewed. No pertinent family history.  Social History Social History   Tobacco Use  . Smoking status: Current Every Day Smoker    Packs/day: 0.50    Types: Cigarettes  . Smokeless tobacco: Never Used  Substance Use Topics  . Alcohol use: Not Currently    Frequency: Never  . Drug use: Not Currently    Review of Systems  All other systems negative except as documented in the HPI. All pertinent positives and negatives as reviewed in the HPI. ____________________________________________   PHYSICAL EXAM:  VITAL SIGNS: ED Triage Vitals  Enc Vitals Group     BP 04/14/19 2318 (!) 145/77     Pulse Rate 04/14/19 2318 95     Resp 04/14/19 2318 18     Temp 04/14/19 2318 98.4 F (36.9 C)     Temp Source 04/14/19 2318 Oral     SpO2 04/14/19 2318 100 %     Weight --      Height 04/14/19 2318 5\' 3"  (1.6 m)    Constitutional: Alert and oriented. Well appearing and in no acute distress. Eyes: Conjunctivae are normal. PERRL. EOMI. Head: Atraumatic. Nose: No congestion/rhinnorhea. GU: mild scrotal edema with mild erythema as well. Incision from scrotum towards rectum,  clean, dry intact, well healing with mild scab but also with feculent leakage from rectal area. Wound itself with mild induration but no erythema, drainage, warmth, fluctuance.  Testicles with normal lie, no engorgement or masses.  Mouth/Throat: Mucous membranes are moist.  Oropharynx non-erythematous. Neck: No stridor.  No meningeal signs.   Cardiovascular: Normal rate, regular rhythm. Good peripheral circulation. Grossly normal heart sounds.   Respiratory: Normal respiratory effort.  No retractions. Lungs CTAB. Gastrointestinal: Soft and nontender. No distention.   Musculoskeletal: No lower extremity tenderness nor edema. No gross deformities of extremities. Neurologic:  Normal speech and language. No gross focal neurologic deficits are appreciated.  Skin:  Skin is warm, dry and intact. No rash noted.   ____________________________________________    INITIAL IMPRESSION / ASSESSMENT AND PLAN / ED COURSE  Wound appears to be healing well.  Do not see obvious evidence of infection.  He has mild scrotal edema but not really consistent with infection or Fournier's.  Clear urine in his Foley.  His some fecal drainage also likely from somewhat poor hygiene because of concern to hurt the wound so he has been wiping very well.  No see obvious infection but patient is very concerned that it could be infected based on what his urologist told him and what his girlfriend's been telling him so we will start a course of antibiotics to be his him and his urologist.  Low suspicion for testicular torsion or testicular pathology size may be epididymitis with a catheter and by think that his symptoms may be just related to the catheter itself.  No indication for imaging further work-up or admission at this time.  He needs to follow-up with his urologist.  Pertinent labs & imaging results that were available during my care of the patient were reviewed by me and considered in my medical decision making (see chart for details).  A medical screening exam was performed and I feel the patient has had an appropriate workup for their chief complaint at this time and likelihood of emergent condition existing is low. They have been counseled on decision, discharge, follow up and which symptoms necessitate immediate return to the emergency department. They or their family verbally stated understanding and agreement with plan and discharged in stable condition.   ____________________________________________  FINAL CLINICAL IMPRESSION(S) / ED DIAGNOSES  Final diagnoses:  Post-op pain      MEDICATIONS GIVEN DURING THIS VISIT:  Medications  oxyCODONE (Oxy IR/ROXICODONE) immediate release tablet 15 mg (has no administration in time range)  Chlorhexidine Gluconate Cloth 2 % PADS 6 each (has no administration in time range)  white petrolatum (VASELINE) gel (has no administration in time range)     NEW OUTPATIENT MEDICATIONS STARTED DURING THIS VISIT:  New Prescriptions   CHLORHEXIDINE GLUCONATE 2 % PADS    Apply 2 each topically 2 (two) times a day for 7 days.   CLINDAMYCIN (CLEOCIN) 300 MG CAPSULE    Take 1 capsule (300 mg total) by mouth 4 (four) times daily. X 7 days   OXYCODONE (ROXICODONE) 5 MG IMMEDIATE RELEASE TABLET    Take 2-3 tablets (10-15 mg total) by mouth every 4 (four) hours as needed for severe pain.    Note:  This note was prepared with assistance of Dragon voice recognition software. Occasional wrong-word or sound-a-like substitutions may have occurred due to the inherent limitations of voice recognition software.   Gwin Eagon, Barbara CowerJason, MD 04/15/19 [phone]

## 2019-11-30 ENCOUNTER — Emergency Department (HOSPITAL_BASED_OUTPATIENT_CLINIC_OR_DEPARTMENT_OTHER): Payer: Self-pay

## 2019-11-30 ENCOUNTER — Other Ambulatory Visit: Payer: Self-pay

## 2019-11-30 ENCOUNTER — Encounter (HOSPITAL_BASED_OUTPATIENT_CLINIC_OR_DEPARTMENT_OTHER): Payer: Self-pay

## 2019-11-30 ENCOUNTER — Emergency Department (HOSPITAL_BASED_OUTPATIENT_CLINIC_OR_DEPARTMENT_OTHER)
Admission: EM | Admit: 2019-11-30 | Discharge: 2019-11-30 | Disposition: A | Discharge status: Home / Self Care | Payer: Self-pay | Attending: Emergency Medicine | Admitting: Emergency Medicine

## 2019-11-30 DIAGNOSIS — F17210 Nicotine dependence, cigarettes, uncomplicated: Secondary | ICD-10-CM | POA: Insufficient documentation

## 2019-11-30 DIAGNOSIS — Y9389 Activity, other specified: Secondary | ICD-10-CM | POA: Insufficient documentation

## 2019-11-30 DIAGNOSIS — Y929 Unspecified place or not applicable: Secondary | ICD-10-CM | POA: Insufficient documentation

## 2019-11-30 DIAGNOSIS — Y999 Unspecified external cause status: Secondary | ICD-10-CM | POA: Insufficient documentation

## 2019-11-30 DIAGNOSIS — J45909 Unspecified asthma, uncomplicated: Secondary | ICD-10-CM | POA: Insufficient documentation

## 2019-11-30 DIAGNOSIS — S60222A Contusion of left hand, initial encounter: Secondary | ICD-10-CM | POA: Insufficient documentation

## 2019-11-30 DIAGNOSIS — W2209XA Striking against other stationary object, initial encounter: Secondary | ICD-10-CM | POA: Insufficient documentation

## 2019-11-30 DIAGNOSIS — Z79899 Other long term (current) drug therapy: Secondary | ICD-10-CM | POA: Insufficient documentation

## 2019-11-30 MED ORDER — IBUPROFEN 400 MG PO TABS
600.0000 mg | ORAL_TABLET | Freq: Once | ORAL | Status: AC
  Administered 2019-11-30: 600 mg via ORAL
  Filled 2019-11-30: qty 1

## 2019-11-30 NOTE — ED Notes (Signed)
Pt reports punching a box today at work to break it down and punching through it hitting a concrete floor. Swelling noted to left knuckles, pt reports he is unable to close his hand.

## 2019-11-30 NOTE — ED Provider Notes (Signed)
[facility]ERGENC[facility] DEPARTMENT Provider Note   CSN: [id number] Arrival date & time: 11/30/19  1658     History Chief Complaint  Patient presents with  . Hand Injury    Charles Meyers is a 38 y.o. male with history of anxiety who presents with a left hand injury.  The patient states that he is ambidextrous.  He states that he was trying to open a cardboard box to break it down and he punched downwards and ended up hitting a concrete floor.  He was wearing gloves but then felt like his skin was stretching.  He took off the glove and he noticed swelling over the dorsal aspect of his hand.  He is having difficulty making a fist.  He is able to move his fingers.  Nothing makes it better.  Movement makes it worse.  HPI     Past Medical History:  Diagnosis Date  . Anxiety   . Asthma   . Urethral stricture in male     Patient Active Problem List   Diagnosis Date Noted  . Alcohol withdrawal ([place]) 10/20/2015  . Alcohol use disorder, severe, dependence ([place]) 10/20/2015  . Tobacco use disorder 10/20/2015  . Alcohol-induced depressive disorder with moderate or severe use disorder with onset during intoxication ([place]) 10/20/2015    Past Surgical History:  Procedure Laterality Date  . URETHRAL DILATION         No family history on file.  Social History   Tobacco Use  . Smoking status: Current Every Day Smoker    Packs/day: 0.50    Types: Cigarettes  . Smokeless tobacco: Never Used  Substance Use Topics  . Alcohol use: Not Currently  . Drug use: Yes    Types: Marijuana    Home Medications Prior to Admission medications   Medication Sig Start Date End Date Taking? Authorizing Provider  amoxicillin (AMOXIL) 500 MG capsule Take 1 capsule (500 mg total) by mouth 3 (three) times daily. 08/22/18   Petrucelli, Samantha R, PA-C  amphetamine-dextroamphetamine (ADDERALL XR) 20 MG 24 hr capsule Take by mouth.    [provider]  benzonatate (TESSALON) 100 MG  capsule Take 1 capsule (100 mg total) by mouth every 8 (eight) hours. 11/30/16   Deno Etienne, DO  chlordiazePOXIDE (LIBRIUM) 25 MG capsule Take 1 capsule (25 mg total) by mouth 3 (three) times daily as needed for anxiety. 10/20/15   Hildred Priest, MD  clindamycin (CLEOCIN) 300 MG capsule Take 1 capsule (300 mg total) by mouth 4 (four) times daily. X 7 days 04/15/19   Mesner, Corene Cornea, MD  fluticasone [facility]) 50 MCG/ACT nasal spray Place 2 sprays into both nostrils daily. 09/24/18   Couture, Cortni S, PA-C  HYDROcodone-acetaminophen (NORCO/VICODIN) 5-325 MG tablet Take 1 tablet by mouth every 6 (six) hours as needed for up to 10 doses. 02/15/19   Curatolo, Adam, DO  hydrOXYzine (ATARAX/VISTARIL) 50 MG tablet Take 1 tablet (50 mg total) by mouth at bedtime. 10/20/15   Hildred Priest, MD  methocarbamol (ROBAXIN) 500 MG tablet Take 1 tablet (500 mg total) by mouth every 8 (eight) hours as needed. 08/22/18   Petrucelli, Samantha R, PA-C  naproxen (NAPROSYN) 500 MG tablet Take 1 tablet (500 mg total) by mouth 2 (two) times daily. 08/22/18   Petrucelli, Samantha R, PA-C  ondansetron (ZOFRAN) 4 MG tablet TAKE 1 TABLET BY MOUTH EVERY 6 HOURS FOR 7 DAYS 01/21/19   [provider]  oxybutynin (DITROPAN) 5 MG tablet Take by mouth. 01/28/17  [provider]  oxyCODONE (ROXICODONE) 5 MG immediate release tablet Take 2-3 tablets (10-15 mg total) by mouth every 4 (four) hours as needed for severe pain. 04/15/19   Mesner, Barbara CowerJason, MD    Allergies    Ciprofloxacin and Morphine and related  Review of Systems   Review of Systems  Musculoskeletal: Positive for arthralgias.  Skin: Positive for color change. Negative for wound.    Physical Exam Updated Vital Signs BP (!) 148/92 (BP Location: Right Arm)   Pulse 78   Temp 98.7 F (37.1 C) (Oral)   Resp 16   Ht 5\' 6"  (1.676 m)   Wt 70.3 kg   SpO2 98%   BMI 25.02 kg/m   Physical Exam Vitals and nursing note reviewed.    Constitutional:      General: He is not in acute distress.    Appearance: Normal appearance. He is well-developed. He is not ill-appearing.     Comments: Anxious  HENT:     Head: Normocephalic and atraumatic.  Eyes:     General: No scleral icterus.       Right eye: No discharge.        Left eye: No discharge.     Conjunctiva/sclera: Conjunctivae normal.     Pupils: Pupils are equal, round, and reactive to light.  Cardiovascular:     Rate and Rhythm: Normal rate.  Pulmonary:     Effort: Pulmonary effort is normal. No respiratory distress.  Abdominal:     General: There is no distension.  Musculoskeletal:     Cervical back: Normal range of motion.     Comments: Left hand: There is a moderate sized hematoma over the 3rd and 2nd metacarpals. Pt is unable to make a fist. He is able to wiggle all fingers and make an "ok" sign. Cap refill <2. N/V intact.   Skin:    General: Skin is warm and dry.  Neurological:     Mental Status: He is alert and oriented to person, place, and time.  Psychiatric:        Behavior: Behavior normal.     ED Results / Procedures / Treatments   Labs (all labs ordered are listed, but only abnormal results are displayed) Labs Reviewed - No data to display  EKG None  Radiology DG Hand Complete Left  Result Date: 11/30/2019 CLINICAL DATA:  Punched concrete. Pain and swelling to 2nd and 3rd metacarpal areas EXAM: LEFT HAND - COMPLETE 3+ VIEW COMPARISON:  None. FINDINGS: There is no evidence of fracture or dislocation. There is no evidence of arthropathy or other focal bone abnormality. Soft tissues are unremarkable. IMPRESSION: Negative. Electronically Signed   By: Charlett NoseKevin  Dover M.D.   On: 11/30/2019 17:55    Procedures Procedures (including critical care time)  Medications Ordered in ED Medications - No data to display  ED Course  I have reviewed the triage vital signs and the nursing notes.  Pertinent labs & imaging results that were available  during my care of the patient were reviewed by me and considered in my medical decision making (see chart for details).  38 year old male presents with left hand injury after punching a concrete floor earlier today.  He has a hematoma over the dorsal aspect of his hand.  X-rays negative.  Advised conservative management with ice, NSAIDs or Tylenol.  He was given a work note.   MDM Rules/Calculators/A&P  Final Clinical Impression(s) / ED Diagnoses Final diagnoses:  Contusion of left hand, initial encounter    Rx / DC Orders ED Discharge Orders    None       Bethel Born, PA-C 11/30/19 1813    Jacalyn Lefevre, MD 11/30/19 Izell Rogers

## 2019-11-30 NOTE — ED Notes (Signed)
Patient transported to X-ray 

## 2019-11-30 NOTE — ED Triage Notes (Addendum)
Pt states he punched through a box at work and struck left hand on floor~315pm-swelling noted-pt has ice pack in place upon arrival-NAD-steady gait

## 2019-11-30 NOTE — ED Notes (Signed)
ED Provider at bedside. 

## 2019-11-30 NOTE — Discharge Instructions (Addendum)
Ice - ice for 20 minutes at a time, several times a day. Switch to heat after 2 days Elevate - elevate the hand above level of heart to help reduce swelling Take Ibuprofen or Tylenol for pain

## 2019-12-10 ENCOUNTER — Other Ambulatory Visit: Payer: Self-pay

## 2019-12-10 ENCOUNTER — Encounter (HOSPITAL_BASED_OUTPATIENT_CLINIC_OR_DEPARTMENT_OTHER): Payer: Self-pay | Admitting: *Deleted

## 2019-12-10 ENCOUNTER — Emergency Department (HOSPITAL_BASED_OUTPATIENT_CLINIC_OR_DEPARTMENT_OTHER): Payer: Self-pay

## 2019-12-10 ENCOUNTER — Emergency Department (HOSPITAL_BASED_OUTPATIENT_CLINIC_OR_DEPARTMENT_OTHER)
Admission: EM | Admit: 2019-12-10 | Discharge: 2019-12-10 | Disposition: A | Discharge status: Home / Self Care | Payer: Self-pay | Attending: Emergency Medicine | Admitting: Emergency Medicine

## 2019-12-10 DIAGNOSIS — F17210 Nicotine dependence, cigarettes, uncomplicated: Secondary | ICD-10-CM | POA: Insufficient documentation

## 2019-12-10 DIAGNOSIS — J45909 Unspecified asthma, uncomplicated: Secondary | ICD-10-CM | POA: Insufficient documentation

## 2019-12-10 DIAGNOSIS — M545 Low back pain, unspecified: Secondary | ICD-10-CM

## 2019-12-10 MED ORDER — METHOCARBAMOL 500 MG PO TABS: 500.0000 mg | ORAL_TABLET | Freq: Two times a day (BID) | ORAL | 0 refills | Status: DC

## 2019-12-10 MED ORDER — TRAMADOL HCL 50 MG PO TABS: 50.0000 mg | ORAL_TABLET | Freq: Four times a day (QID) | ORAL | 0 refills | Status: DC | PRN

## 2019-12-10 MED ORDER — KETOROLAC TROMETHAMINE 60 MG/2ML IM SOLN
30.0000 mg | Freq: Once | INTRAMUSCULAR | Status: AC
  Administered 2019-12-10: 30 mg via INTRAMUSCULAR
  Filled 2019-12-10: qty 2

## 2019-12-10 MED ORDER — METHOCARBAMOL 500 MG PO TABS
1000.0000 mg | ORAL_TABLET | Freq: Once | ORAL | Status: AC
  Administered 2019-12-10: 1000 mg via ORAL
  Filled 2019-12-10: qty 2

## 2019-12-10 MED FILL — traMADol HCL 50 MG TABS: 50 | 4 days supply | Qty: 15 | Fill #0

## 2019-12-10 MED FILL — METHOCARBAMOL 500 MG TABS: 500 | 10 days supply | Qty: 20 | Fill #0

## 2019-12-10 NOTE — ED Provider Notes (Signed)
[facility] EMERGENCY DEPARTMENT Provider Note   CSN: [id number] Arrival date & time: 12/10/19  1017     History Chief Complaint  Patient presents with  . Back Pain    Charles Meyers is a 38 y.o. male with history of chronic back pain presents with back pain.  Patient states that he has been having low back pain for several months after starting this new job.  About 4 days ago the pain acutely worsened when he lifted something at his house and he felt a snap and a pop.  He is also been lifting at his job which has been making it worse.  He states that he just rested for the past 4 days and took ibuprofen as needed.  Today when he went to work he twisted and felt acutely worsening back pain and therefore decided to come to the ED.  He reports he can't get comfortable lying, sitting, or standing. He states that he cannot lose his job because he is caring for his wife who has cancer.  He has had these symptoms before several years ago but never followed up with a doctor because he has not been able to hold a steady job and get insurance.  No fever, syncope, trauma, unexplained weight loss, hx of cancer, loss of bowel/bladder function, saddle anesthesia, urinary retention, IVDU.  He reports his left hand is improving when he was seen on December 18 by me.  HPI     Past Medical History:  Diagnosis Date  . Anxiety   . Asthma   . Urethral stricture in male     Patient Active Problem List   Diagnosis Date Noted  . Alcohol withdrawal ([facility]) 10/20/2015  . Alcohol use disorder, severe, dependence ([facility]) 10/20/2015  . Tobacco use disorder 10/20/2015  . 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 URETHRAL DILATION         Family History  Problem Relation Age of Onset  . Cancer Mother   . Heart attack Father     Social History   Tobacco Use   . Smoking status: Current Every Day Smoker    Packs/day: 0.50    Types: Cigarettes  . Smokeless tobacco: Never Used  Substance Use Topics  . Alcohol use: Not Currently  . Drug use: Yes    Types: Marijuana    Home Medications Prior to Admission medications   Medication Sig Start Date End Date Taking? Authorizing Provider  ibuprofen (ADVIL) 800 MG tablet Take 800 mg by mouth every 8 (eight) hours as needed.   Yes [provider]    Allergies    Ciprofloxacin and Morphine and related  Review of Systems   Review of Systems  Constitutional: Negative for fever.  Genitourinary: Negative for difficulty urinating.  Musculoskeletal: Positive for back pain.    Physical Exam Updated Vital Signs BP 136/78 (BP Location: Right Arm)   Pulse 88   Temp 98.2 F (36.8 C) (Oral)   Resp 16   Ht 5\' 9"  (1.753 m)   Wt 72.6 kg   SpO2 100%   BMI 23.63 kg/m   Physical Exam Vitals and nursing note reviewed.  Constitutional:      General: He is in acute distress (in pain).     Appearance: Normal appearance. He is well-developed. He is not ill-appearing.  HENT:     Head: Normocephalic and atraumatic.  Eyes:     General: No scleral icterus.       Right eye: No discharge.        Left eye: No discharge.     Conjunctiva/sclera: Conjunctivae normal.     Pupils: Pupils are equal, round, and reactive to light.  Cardiovascular:     Rate and Rhythm: Normal rate.  Pulmonary:     Effort: Pulmonary effort is normal. No respiratory distress.  Abdominal:     General: There is no distension.  Musculoskeletal:     Cervical back: Normal range of motion.     Comments: Back: Inspection: No masses, deformity, or rash Palpation: Point tenderness over lumbar spine.  Strength: 5/5 in lower extremities and normal plantar and dorsiflexion Sensation: Intact sensation with light touch in lower extremities bilaterally Gait: Antalgic gait   Skin:    General: Skin is warm and dry.  Neurological:       Mental Status: He is alert and oriented to person, place, and time.  Psychiatric:        Behavior: Behavior normal.     ED Results / Procedures / Treatments   Labs (all labs ordered are listed, but only abnormal results are displayed) Labs Reviewed - No data to display  EKG None  Radiology DG Lumbar Spine Complete  Result Date: 12/10/2019 CLINICAL DATA:  Acute low back pain EXAM: LUMBAR SPINE - COMPLETE 4+ VIEW COMPARISON:  05/08/2017 FINDINGS: There is no evidence of lumbar spine fracture. Alignment is normal. T11-12 disc narrowing and ventral spurring. IMPRESSION: No acute finding or change from 2018. Electronically Signed   By: Monte Fantasia M.D.   On: 12/10/2019 11:37    Procedures Procedures (including critical care time)  Medications Ordered in ED Medications  ketorolac (TORADOL) injection 30 mg (30 mg Intramuscular Given 12/10/19 1107)  methocarbamol (ROBAXIN) tablet 1,000 mg (1,000 mg Oral Given 12/10/19 1107)    ED Course  I have reviewed the triage vital signs and the nursing notes.  Pertinent labs & imaging results that were available during my care of the patient were reviewed by me and considered in my medical decision making (see chart for details).  38 year old male presents with acute on chronic back pain. Vitals are normal. He is very uncomfortable appearing. He has tenderness over the lower back. Lower extremity strength is intact although ROM is limited due to pain. He is ambulatory. He is requesting Xray - I advised this would likely not be a very helpful test but he still wants to get it. Will order meds and xray. No indication for advanced imaging today.  Xray shows mild degenerative changes. Will give rx for meds and work note. He was encouraged to f/u with a PCP as he reports lots of chronic health issues and he is uninsured. He was given referral to [facility].   MDM Rules/Calculators/A&P                       Final Clinical  Impression(s) / ED Diagnoses Final diagnoses:  Acute midline low back pain without sciatica    Rx / DC Orders ED Discharge Orders    None       Recardo Evangelist, PA-C 12/10/19 1239    Charlesetta Shanks, MD 12/11/19 [phone]

## 2019-12-10 NOTE — ED Triage Notes (Signed)
Back pain started 2 months ago from moving the bed spring.

## 2019-12-10 NOTE — Discharge Instructions (Signed)
Continue Ibuprofen - take this medicine with food. Take muscle relaxer at bedtime to help you sleep. This medicine makes you drowsy so do not take before driving or work Take Tramadol for more severe pain - do not drink alcohol or drive while taking this Use a heating pad for sore muscles - use for 20 minutes several times a day Try gentle range of motion exercises Return for worsening symptoms

## 2020-04-21 ENCOUNTER — Emergency Department (HOSPITAL_BASED_OUTPATIENT_CLINIC_OR_DEPARTMENT_OTHER): Payer: Self-pay

## 2020-04-21 ENCOUNTER — Encounter (HOSPITAL_BASED_OUTPATIENT_CLINIC_OR_DEPARTMENT_OTHER): Payer: Self-pay | Admitting: *Deleted

## 2020-04-21 ENCOUNTER — Other Ambulatory Visit: Payer: Self-pay

## 2020-04-21 ENCOUNTER — Emergency Department (HOSPITAL_BASED_OUTPATIENT_CLINIC_OR_DEPARTMENT_OTHER)
Admission: EM | Admit: 2020-04-21 | Discharge: 2020-04-21 | Disposition: A | Discharge status: Home / Self Care | Payer: Self-pay | Attending: Emergency Medicine | Admitting: Emergency Medicine

## 2020-04-21 DIAGNOSIS — M79672 Pain in left foot: Secondary | ICD-10-CM | POA: Insufficient documentation

## 2020-04-21 DIAGNOSIS — F17210 Nicotine dependence, cigarettes, uncomplicated: Secondary | ICD-10-CM | POA: Insufficient documentation

## 2020-04-21 DIAGNOSIS — R109 Unspecified abdominal pain: Secondary | ICD-10-CM

## 2020-04-21 DIAGNOSIS — J45909 Unspecified asthma, uncomplicated: Secondary | ICD-10-CM | POA: Insufficient documentation

## 2020-04-21 DIAGNOSIS — M79673 Pain in unspecified foot: Secondary | ICD-10-CM

## 2020-04-21 LAB — URINALYSIS, ROUTINE W REFLEX MICROSCOPIC
Bilirubin Urine: NEGATIVE
Glucose, UA: NEGATIVE mg/dL
Hgb urine dipstick: NEGATIVE
Ketones, ur: NEGATIVE mg/dL
Leukocytes,Ua: NEGATIVE
Nitrite: NEGATIVE
Protein, ur: NEGATIVE mg/dL
Specific Gravity, Urine: <1.005 — ABNORMAL LOW (ref 1.005–1.030)
pH: 8 (ref 5.0–8.0)

## 2020-04-21 LAB — COMPREHENSIVE METABOLIC PANEL
ALT: 17 U/L (ref 0–44)
AST: 18 U/L (ref 15–41)
Albumin: 4.3 g/dL (ref 3.5–5.0)
Alkaline Phosphatase: 60 U/L (ref 38–126)
Anion gap: 12 (ref 5–15)
BUN: 22 mg/dL — ABNORMAL HIGH (ref 6–20)
CO2: 24 mmol/L (ref 22–32)
Calcium: 9 mg/dL (ref 8.9–10.3)
Chloride: 101 mmol/L (ref 98–111)
Creatinine, Ser: 0.74 mg/dL (ref 0.61–1.24)
GFR calc Af Amer: >60 mL/min (ref >60)
GFR calc non Af Amer: >60 mL/min (ref >60)
Glucose, Bld: 94 mg/dL (ref 70–99)
Potassium: 3.8 mmol/L (ref 3.5–5.1)
Sodium: 137 mmol/L (ref 135–145)
Total Bilirubin: 0.8 mg/dL (ref 0.3–1.2)
Total Protein: 7.2 g/dL (ref 6.5–8.1)

## 2020-04-21 LAB — CBC WITH DIFFERENTIAL/PLATELET
Abs Immature Granulocytes: 0.03 10*3/uL (ref 0.00–0.07)
Basophils Absolute: 0.1 10*3/uL (ref 0.0–0.1)
Basophils Relative: 1 %
Eosinophils Absolute: 0.2 10*3/uL (ref 0.0–0.5)
Eosinophils Relative: 2 %
HCT: 41.2 % (ref 39.0–52.0)
Hemoglobin: 14.9 g/dL (ref 13.0–17.0)
Immature Granulocytes: 0 %
Lymphocytes Relative: 30 %
Lymphs Abs: 3 10*3/uL (ref 0.7–4.0)
MCH: 35.6 pg — ABNORMAL HIGH (ref 26.0–34.0)
MCHC: 36.2 g/dL — ABNORMAL HIGH (ref 30.0–36.0)
MCV: 98.6 fL (ref 80.0–100.0)
Monocytes Absolute: 0.8 10*3/uL (ref 0.1–1.0)
Monocytes Relative: 8 %
Neutro Abs: 6 10*3/uL (ref 1.7–7.7)
Neutrophils Relative %: 59 %
Platelets: 281 10*3/uL (ref 150–400)
RBC: 4.18 MIL/uL — ABNORMAL LOW (ref 4.22–5.81)
RDW: 13.1 % (ref 11.5–15.5)
WBC: 10.1 10*3/uL (ref 4.0–10.5)
nRBC: 0 % (ref 0.0–0.2)

## 2020-04-21 MED ORDER — IOHEXOL 300 MG/ML  SOLN
100.0000 mL | Freq: Once | INTRAMUSCULAR | Status: AC
  Administered 2020-04-21: 19:00:00 100 mL via INTRAVENOUS

## 2020-04-21 MED ORDER — AMOXICILLIN-POT CLAVULANATE 875-125 MG PO TABS: 1.0000 | ORAL_TABLET | Freq: Two times a day (BID) | ORAL | 0 refills | Status: DC

## 2020-04-21 MED ORDER — OXYCODONE-ACETAMINOPHEN 5-325 MG PO TABS
1.0000 | ORAL_TABLET | Freq: Once | ORAL | Status: AC
  Administered 2020-04-21: 1 via ORAL
  Filled 2020-04-21: qty 1

## 2020-04-21 NOTE — Discharge Instructions (Addendum)
Regarding your foot pain, recommend taking Tylenol and Motrin as needed. Recommend rest, ice and elevation. Recommend follow-up with our sports medicine specialist as listed in the discharge instructions. Regarding your abdominal, groin pain, may also take Tylenol and Motrin for this as well. Would recommend follow-up with your primary doctor about this pain. The radiologist suspected you may have early diverticulitis. We have prescribed an antibiotic for this. Additionally, the radiologist recommended discussing CT findings (thickening of your colon) and need for colonoscopy. Have provided information for gastroenterology. You will need to call them to make an appointment.

## 2020-04-21 NOTE — ED Notes (Signed)
Pt to CT scan.

## 2020-04-21 NOTE — ED Triage Notes (Signed)
Right groin pain for 2 days and left foot for 3 weeks.  Denies any injury.

## 2020-04-21 NOTE — ED Notes (Signed)
Rt groin swelling a couple of days ago , alittle better now a nd his left foot hurts , denies injury to foot   But the way he has to walk for foot pain makes his back hurt

## 2020-04-21 NOTE — ED Notes (Signed)
Up to void but  States cannot due to strictures

## 2020-04-21 NOTE — ED Notes (Signed)
Back from CT

## 2020-04-22 NOTE — ED Provider Notes (Signed)
[facility] EMERGENCY DEPARTMENT Provider Note   CSN: [id number] Arrival date & time: 04/21/20  1610     History Chief Complaint  Patient presents with  . Groin pain, foot pain    Charles Meyers is a 39 y.o. male.  Presents to ER with 2 complaints, right abdominal pain, left foot pain.  Regarding the foot pain, reports this is been going on for the past couple weeks, does not recall specific injury but states it is worse with ambulation, heavy lifting.  Primarily in the middle of his foot.  Has not noted any swelling, no fevers.  Regarding his groin pain, states that he has some pain in his right lower abdomen, right groin area.  No pain or swelling to his testicles.  Ongoing throughout the day today.  Worse with certain movements, cough.   HPI     Past Medical History:  Diagnosis Date  . Anxiety   . Asthma   . Urethral stricture in male     Patient Active Problem List   Diagnosis Date Noted  . Alcohol withdrawal ([facility]) 10/20/2015  . Alcohol use disorder, severe, dependence ([facility]) 10/20/2015  . Tobacco use disorder 10/20/2015  . 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 URETHRAL DILATION         Family History  Problem Relation Age of Onset  . Cancer Mother   . Heart attack Father     Social History   Tobacco Use  . Smoking status: Current Every Day Smoker    Packs/day: 0.50    Types: Cigarettes  . Smokeless tobacco: Never Used  Substance Use Topics  . Alcohol use: Not Currently  . Drug use: Yes    Types: Marijuana    Home Medications Prior to Admission medications   Medication Sig Start Date End Date Taking? Authorizing Provider  amoxicillin-clavulanate (AUGMENTIN) 875-125 MG tablet Take 1 tablet by mouth every 12 (twelve) hours. 04/21/20   Milagros Loll, MD    Allergies    Ciprofloxacin and Morphine  and related  Review of Systems   Review of Systems  Constitutional: Negative for chills and fever.  HENT: Negative for ear pain and sore throat.   Eyes: Negative for pain and visual disturbance.  Respiratory: Negative for cough and shortness of breath.   Cardiovascular: Negative for chest pain and palpitations.  Gastrointestinal: Positive for abdominal pain. Negative for vomiting.  Genitourinary: Negative for dysuria and hematuria.  Musculoskeletal: Positive for arthralgias. Negative for back pain.  Skin: Negative for color change and rash.  Neurological: Negative for seizures and syncope.  All other systems reviewed and are negative.   Physical Exam Updated Vital Signs BP 138/87 (BP Location: Left Arm)   Pulse 69   Temp 98.8 F (37.1 C) (Oral)   Resp 16   Ht 5\' 10"  (1.778 m)   Wt 72.6 kg   SpO2 98%   BMI 22.96 kg/m   Physical Exam Vitals and nursing note reviewed.  Constitutional:      Appearance: He is well-developed.  HENT:     Head: Normocephalic and atraumatic.  Eyes:     Conjunctiva/sclera: Conjunctivae normal.  Cardiovascular:     Rate and Rhythm: Normal rate and regular rhythm.     Heart sounds: No murmur.  Pulmonary:     Effort: Pulmonary effort is normal. No respiratory distress.     Breath  sounds: Normal breath sounds.  Abdominal:     Palpations: Abdomen is soft.     Comments: Tenderness over right lower quadrant mild, no rebound or guarding  Genitourinary:    Comments: Penis appears normal, bilateral testicles appear normal, no tenderness, no swelling, there is tenderness over the right inguinal region, no palpable hernia Musculoskeletal:        General: No deformity or signs of injury.     Cervical back: Neck supple.     Comments: Left foot: There is tenderness over the left midfoot, there is no obvious deformity, no edema or erythema appreciated, normal DP and PT pulses, sensation and motor intact  Skin:    General: Skin is warm and dry.    Neurological:     Mental Status: He is alert.     ED Results / Procedures / Treatments   Labs (all labs ordered are listed, but only abnormal results are displayed) Labs Reviewed  CBC WITH DIFFERENTIAL/PLATELET - Abnormal; Notable for the following components:      Result Value   RBC 4.18 (*)    MCH 35.6 (*)    MCHC 36.2 (*)    All other components within normal limits  COMPREHENSIVE METABOLIC PANEL - Abnormal; Notable for the following components:   BUN 22 (*)    All other components within normal limits  URINALYSIS, ROUTINE W REFLEX MICROSCOPIC - Abnormal; Notable for the following components:   Specific Gravity, Urine <1.005 (*)    All other components within normal limits    EKG None  Radiology CT ABDOMEN PELVIS W CONTRAST  Result Date: 04/21/2020 CLINICAL DATA:  Inguinal hernia suspected. Flank pain on the RIGHT. RIGHT groin pain for 2 days, history of urethral stricture and dilation. EXAM: CT ABDOMEN AND PELVIS WITH CONTRAST TECHNIQUE: Multidetector CT imaging of the abdomen and pelvis was performed using the standard protocol following bolus administration of intravenous contrast. CONTRAST:  OMNIPAQUE IOHEXOL 300 MG/ML  SOLN COMPARISON:  04/17/2019 FINDINGS: Lower chest: Incidental imaging of the lung bases is unremarkable. Hepatobiliary: Liver is normal. Gallbladder is normal. No biliary ductal dilation. Pancreas: Pancreas is unremarkable. No ductal dilation. No peripancreatic inflammation. Spleen: Spleen normal size without focal lesion. Adrenals/Urinary Tract: Adrenal glands are normal. No renal mass. Smooth renal contours. No hydronephrosis. Mild thickening of the urinary bladder which is nonspecific given under distension. Stomach/Bowel: Normal appendix. No sign of bowel obstruction. No hernia. Question of colonic diverticulosis and mild colonic thickening of the sigmoid colon. Mild pericolonic stranding. No pericolonic fluid. No free air. Vascular/Lymphatic: Normal  caliber of the abdominal aorta. No atherosclerotic changes. No adenopathy. No pelvic lymphadenopathy. Reproductive: Prostate with calcifications and mild heterogeneity, nonspecific finding on CT. Other: Small fat containing umbilical hernia. No inguinal hernia. No free air. No ascites. Musculoskeletal: Spinal degenerative changes without acute or destructive bone process. IMPRESSION: 1. Small fat containing umbilical hernia. No inguinal hernia. 2. Mild sigmoid thickening and pericolonic stranding could be seen in the setting of mild or early diverticulitis. Correlate clinically. Assessment limited by under distension. Given thickening follow-up colonoscopy may be warranted. 3. Mild thickening of the urinary bladder wall, nonspecific given under distension could in the appropriate clinical setting represent mild cystitis. Electronically Signed   By: Donzetta Kohut M.D.   On: 04/21/2020 19:09   DG Foot Complete Left  Result Date: 04/21/2020 CLINICAL DATA:  Foot pain EXAM: LEFT FOOT - COMPLETE 3+ VIEW COMPARISON:  None. FINDINGS: There is no evidence of fracture or dislocation. There is  no evidence of arthropathy or other focal bone abnormality. Soft tissues are unremarkable. IMPRESSION: Negative. Electronically Signed   By: Donavan Foil M.D.   On: 04/21/2020 19:07    Procedures Procedures (including critical care time)  Medications Ordered in ED Medications  oxyCODONE-acetaminophen (PERCOCET/ROXICET) 5-325 MG per tablet 1 tablet (1 tablet Oral Given 04/21/20 1815)  iohexol (OMNIPAQUE) 300 MG/ML solution 100 mL (100 mLs Intravenous Contrast Given 04/21/20 1848)    ED Course  I have reviewed the triage vital signs and the nursing notes.  Pertinent labs & imaging results that were available during my care of the patient were reviewed by me and considered in my medical decision making (see chart for details).    MDM Rules/Calculators/A&P                      39 year old male presented to ER with  concern for left foot pain and right groin pain.  Regarding left foot pain, there was no significant physical exam abnormality appreciated on my exam exam, x-rays were negative.  Recommend follow-up with Ortho, sports medicine or PCP.  Regarding groin pain, GU exam benign, did have mild tenderness on abdominal exam, inguinal region.  CT abdomen pelvis ordered to further evaluate, no inguinal hernia.  Did have findings consistent with possible early diverticulitis.  Provided Rx for Augmentin for this possible diverticulitis.  Radiologist additionally commented on thickening of the wall in colon and recommended consideration of colonoscopy for further eval. Discussed these findings in detail with patient.  Provided information for GI follow-up.  Discharged home.    After the discussed management above, the patient was determined to be safe for discharge.  The patient was in agreement with this plan and all questions regarding their care were answered.  ED return precautions were discussed and the patient will return to the ED with any significant worsening of condition.  Final Clinical Impression(s) / ED Diagnoses Final diagnoses:  Abdominal pain, unspecified abdominal location  Pain of foot, unspecified laterality    Rx / DC Orders ED Discharge Orders         Ordered    amoxicillin-clavulanate (AUGMENTIN) 875-125 MG tablet  Every 12 hours     04/21/20 2000           Lucrezia Starch, MD 04/22/20 [phone]

## 2020-05-21 IMAGING — DX DG FOOT COMPLETE 3+V*L*
3 series · 3 of 3 positions shown · non-contrast
Comparison: None.

CLINICAL DATA: Hit foot against solid object with fall

EXAM:
LEFT FOOT - COMPLETE 3+ VIEW

[foot ap]
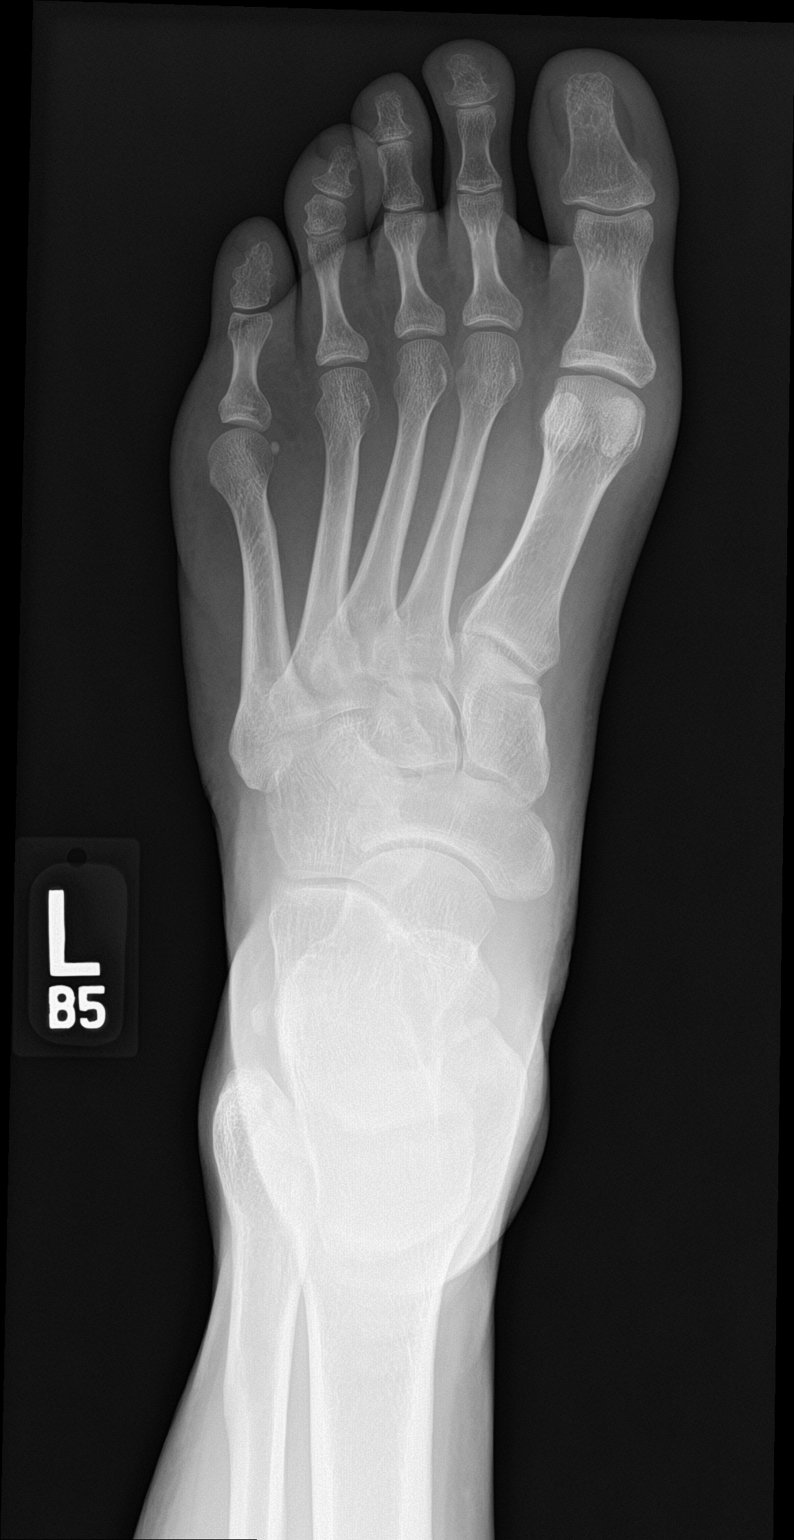

[foot obl]
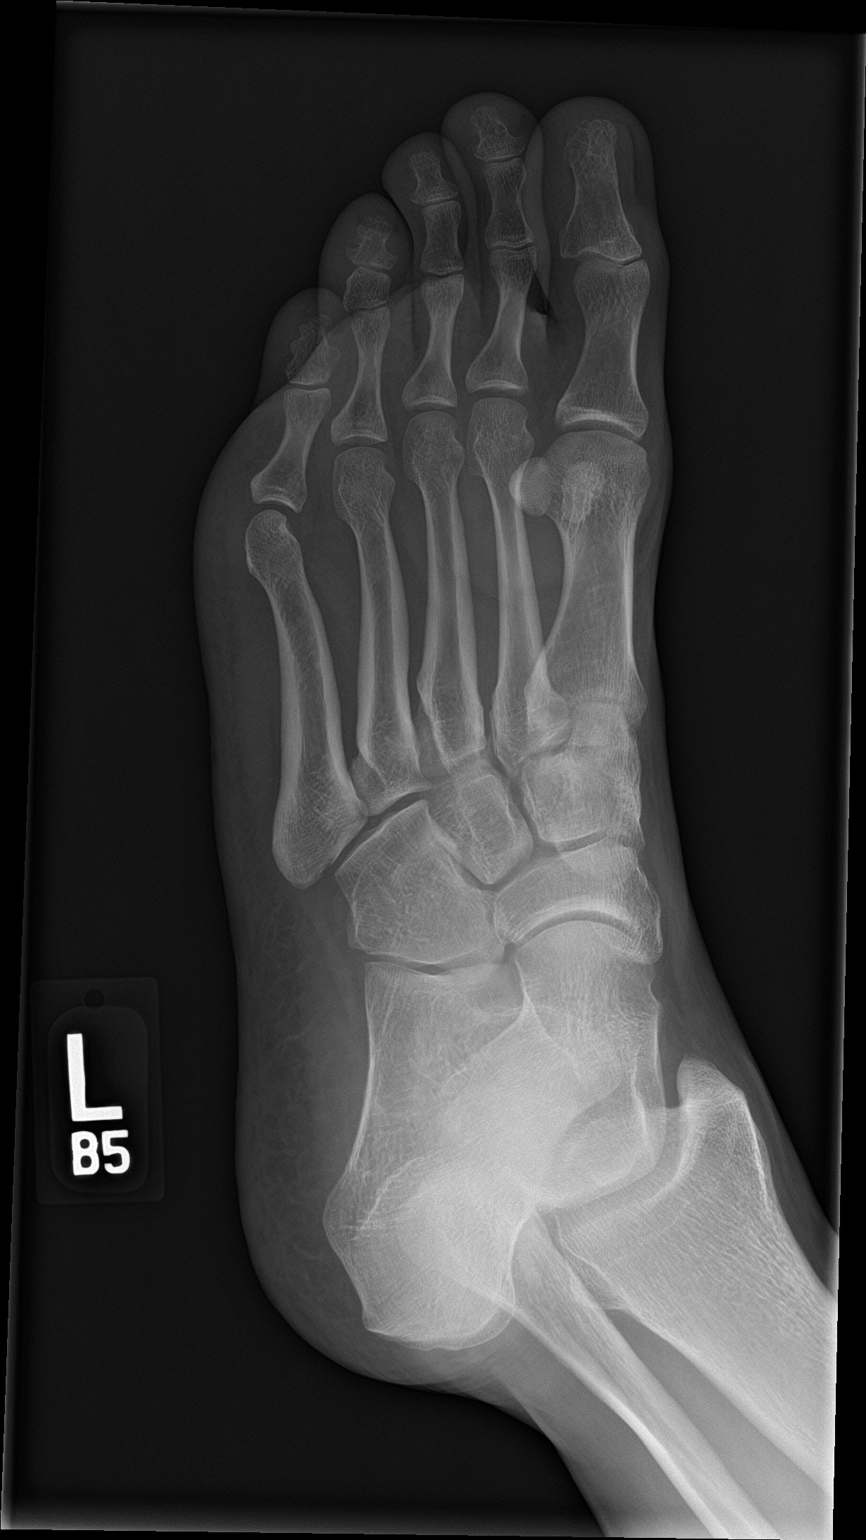

[foot lat]
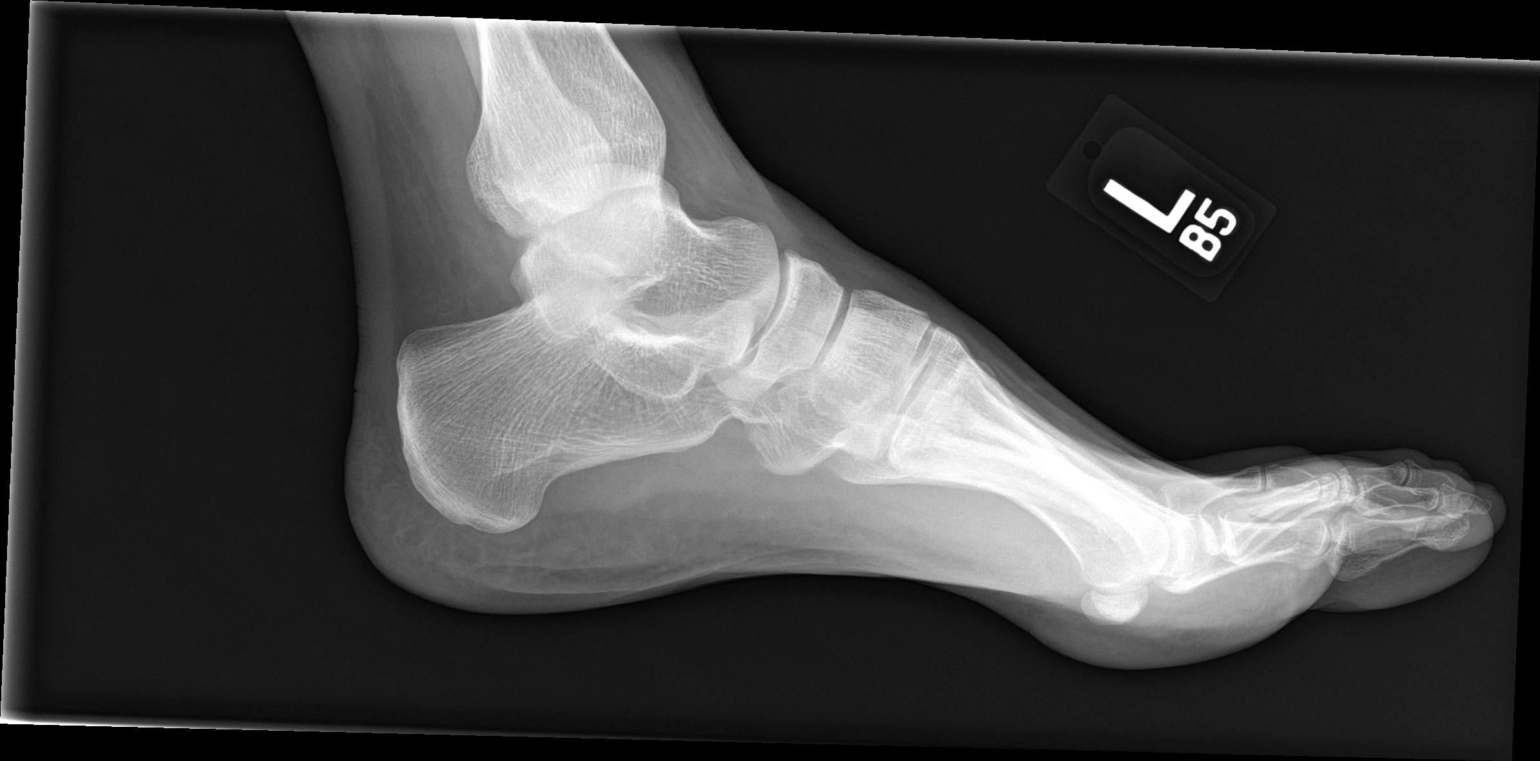

[3 of 3 positions shown; findings below may reference images not displayed]

FINDINGS: Frontal, oblique, and lateral views were obtained. No fracture or
dislocation. Joint spaces appear normal. No erosive change.
IMPRESSION: No fracture or dislocation.  No evident arthropathy.

## 2020-07-07 ENCOUNTER — Emergency Department (HOSPITAL_BASED_OUTPATIENT_CLINIC_OR_DEPARTMENT_OTHER)
Admission: EM | Admit: 2020-07-07 | Discharge: 2020-07-07 | Disposition: A | Discharge status: Home / Self Care | Payer: Self-pay | Attending: Emergency Medicine | Admitting: Emergency Medicine

## 2020-07-07 ENCOUNTER — Encounter (HOSPITAL_BASED_OUTPATIENT_CLINIC_OR_DEPARTMENT_OTHER): Payer: Self-pay | Admitting: *Deleted

## 2020-07-07 ENCOUNTER — Other Ambulatory Visit: Payer: Self-pay

## 2020-07-07 DIAGNOSIS — F17200 Nicotine dependence, unspecified, uncomplicated: Secondary | ICD-10-CM | POA: Insufficient documentation

## 2020-07-07 DIAGNOSIS — M79604 Pain in right leg: Secondary | ICD-10-CM | POA: Insufficient documentation

## 2020-07-07 DIAGNOSIS — K4090 Unilateral inguinal hernia, without obstruction or gangrene, not specified as recurrent: Secondary | ICD-10-CM | POA: Insufficient documentation

## 2020-07-07 DIAGNOSIS — M5416 Radiculopathy, lumbar region: Secondary | ICD-10-CM | POA: Insufficient documentation

## 2020-07-07 DIAGNOSIS — J45909 Unspecified asthma, uncomplicated: Secondary | ICD-10-CM | POA: Insufficient documentation

## 2020-07-07 MED ORDER — HYDROCODONE-ACETAMINOPHEN 5-325 MG PO TABS: ORAL_TABLET | ORAL | 0 refills | Status: DC

## 2020-07-07 MED ORDER — NAPROXEN 500 MG PO TABS: 500.0000 mg | ORAL_TABLET | Freq: Two times a day (BID) | ORAL | 0 refills | Status: DC

## 2020-07-07 MED ORDER — HYDROCODONE-ACETAMINOPHEN 5-325 MG PO TABS
1.0000 | ORAL_TABLET | Freq: Once | ORAL | Status: AC
  Administered 2020-07-07: 1 via ORAL
  Filled 2020-07-07: qty 1

## 2020-07-07 MED ORDER — KETOROLAC TROMETHAMINE 15 MG/ML IJ SOLN
30.0000 mg | Freq: Once | INTRAMUSCULAR | Status: AC
  Administered 2020-07-07: 30 mg via INTRAMUSCULAR
  Filled 2020-07-07: qty 2

## 2020-07-07 MED ORDER — METHOCARBAMOL 500 MG PO TABS: 1000.0000 mg | ORAL_TABLET | Freq: Four times a day (QID) | ORAL | 0 refills | Status: DC

## 2020-07-07 NOTE — ED Notes (Signed)
Pt called for FT2 but pt is not in lobby. Per registration pt has gone outside to speak to his wife. Will call pt again later.

## 2020-07-07 NOTE — Discharge Instructions (Signed)
Please read and follow all provided instructions.  Your diagnoses today include:  1. Non-recurrent inguinal hernia without obstruction or gangrene, unspecified laterality   2. Lumbar radiculopathy     Tests performed today include:  Vital signs - see below for your results today  Medications prescribed:   Robaxin (methocarbamol) - muscle relaxer medication  DO NOT drive or perform any activities that require you to be awake and alert because this medicine can make you drowsy.    Vicodin (hydrocodone/acetaminophen) - narcotic pain medication  DO NOT drive or perform any activities that require you to be awake and alert because this medicine can make you drowsy. BE VERY CAREFUL not to take multiple medicines containing Tylenol (also called acetaminophen). Doing so can lead to an overdose which can damage your liver and cause liver failure and possibly death.   Naproxen - anti-inflammatory pain medication  Do not exceed 500mg  naproxen every 12 hours, take with food  You have been prescribed an anti-inflammatory medication or NSAID. Take with food. Take smallest effective dose for the shortest duration needed for your pain. Stop taking if you experience stomach pain or vomiting.   Take any prescribed medications only as directed.  Home care instructions:   Follow any educational materials contained in this packet  Please rest, use ice or heat on your back for the next several days  Do not lift, push, pull anything more than 10 pounds for the next week  Follow-up instructions: Please follow-up with your primary care provider and the surgical referral listed in the next 1 week for further evaluation of your symptoms.   Return instructions:  SEEK IMMEDIATE MEDICAL ATTENTION IF YOU HAVE:  New numbness, tingling, weakness, or problem with the use of your arms or legs  Severe back pain not relieved with medications  Loss control of your bowels or bladder  Increasing pain in  any areas of the body (such as chest or abdominal pain)  Shortness of breath, dizziness, or fainting.   Worsening nausea (feeling sick to your stomach), vomiting, fever, or sweats  Any other emergent concerns regarding your health   Additional Information:  Your vital signs today were: BP (!) 141/74 (BP Location: Right Arm)   Pulse 89   Temp 97.9 F (36.6 C) (Oral)   Resp 18   Ht 5\' 10"  (1.778 m)   Wt 72.6 kg   SpO2 97%   BMI 22.96 kg/m  If your blood pressure (BP) was elevated above 135/85 this visit, please have this repeated by your doctor within one month. --------------

## 2020-07-07 NOTE — ED Provider Notes (Signed)
[facility] EMERGENCY DEPARTMENT Provider Note   CSN: [id number] Arrival date & time: 07/07/20  1621     History Chief Complaint  Patient presents with  . Back Pain  . Abdominal Pain    Charles Meyers is a 39 y.o. male.  Patient with history of alcohol use disorder presents to the emergency department with complaint of abdominal pain and complaint of back pain with radiation down the right leg.  Patient states that he has a history of hernias.  Typically he has a intermittent bulge in the right groin in the right upper abdomen.  Over the past couple of days he has developed pain in the left groin as well.  He has not felt a lump or a bump in the area, just pain.  He has not had any vomiting, constipation, diarrhea, fevers.  Area is worse with palpation.  Patient also reports having chronic problem with back pain.  Yesterday he developed his typical pain in the right SI joint, however the pain began to shoot down the side of his leg to the calf.  Patient denies warning symptoms of back pain including: fecal incontinence, urinary retention or overflow incontinence, night sweats, waking from sleep with back pain, unexplained fevers or weight loss, h/o cancer, IVDU, recent trauma.          Past Medical History:  Diagnosis Date  . Anxiety   . Asthma   . Urethral stricture in male     Patient Active Problem List   Diagnosis Date Noted  . Alcohol withdrawal (HCC) 10/20/2015  . Alcohol use disorder, severe, dependence (HCC) 10/20/2015  . Tobacco use disorder 10/20/2015  . 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 URETHRAL DILATION         Family History  Problem Relation Age of Onset  . Cancer Mother   . Heart attack Father     Social History   Tobacco Use  . Smoking status: Current Every Day Smoker    Packs/day: 0.50     Types: Cigarettes  . Smokeless tobacco: Never Used  Vaping Use  . Vaping Use: Never used  Substance Use Topics  . Alcohol use: Not Currently  . Drug use: Yes    Types: Marijuana    Home Medications Prior to Admission medications   Medication Sig Start Date End Date Taking? Authorizing Provider  HYDROcodone-acetaminophen (NORCO/VICODIN) 5-325 MG tablet Take 1 tablet every 6 hours as needed for severe pain 07/07/20   Renne Crigler, PA-C  methocarbamol (ROBAXIN) 500 MG tablet Take 2 tablets (1,000 mg total) by mouth 4 (four) times daily. 07/07/20   Renne Crigler, PA-C  naproxen (NAPROSYN) 500 MG tablet Take 1 tablet (500 mg total) by mouth 2 (two) times daily. 07/07/20   Renne Crigler, PA-C    Allergies    Ciprofloxacin and Morphine and related  Review of Systems   Review of Systems  Constitutional: Negative for fever and unexpected weight change.  HENT: Negative for rhinorrhea and sore throat.   Eyes: Negative for redness.  Respiratory: Negative for cough.   Cardiovascular: Negative for chest pain.  Gastrointestinal: Positive for abdominal pain. Negative for constipation, diarrhea, nausea and vomiting.       Neg for fecal incontinence  Genitourinary: Negative for difficulty urinating, dysuria, flank pain and hematuria.       Negative for urinary incontinence or retention  Musculoskeletal:  Positive for back pain. Negative for myalgias.  Skin: Negative for rash.  Neurological: Negative for weakness, numbness and headaches.       Negative for saddle paresthesias     Physical Exam Updated Vital Signs BP (!) 141/74 (BP Location: Right Arm)   Pulse 89   Temp 97.9 F (36.6 C) (Oral)   Resp 18   Ht 5\' 10"  (1.778 m)   Wt 72.6 kg   SpO2 97%   BMI 22.96 kg/m   Physical Exam Nursing note reviewed.  Constitutional:      Appearance: He is well-developed.  HENT:     Head: Normocephalic and atraumatic.  Eyes:     General:        Right eye: No discharge.        Left eye: No  discharge.     Conjunctiva/sclera: Conjunctivae normal.  Cardiovascular:     Rate and Rhythm: Normal rate and regular rhythm.     Heart sounds: Normal heart sounds.  Pulmonary:     Effort: Pulmonary effort is normal.     Breath sounds: Normal breath sounds.  Abdominal:     Palpations: Abdomen is soft.     Tenderness: There is abdominal tenderness in the left lower quadrant. There is no guarding or rebound.     Hernia: A hernia is present. Hernia is present in the right inguinal area.     Comments: Patient with reducible bulge in the right groin without any tenderness.  Patient with focal tenderness in the left groin without any distinct bulge or overlying skin findings.  Musculoskeletal:        General: Normal range of motion.     Cervical back: Normal, normal range of motion and neck supple.     Thoracic back: No tenderness.     Lumbar back: Tenderness (Right lower paraspinous and SI area tenderness to palpation) present. No spasms.     Comments: No step-off noted with palpation of spine.   Skin:    General: Skin is warm and dry.  Neurological:     Mental Status: He is alert.     Sensory: No sensory deficit.     Motor: No abnormal muscle tone.     Deep Tendon Reflexes: Reflexes are normal and symmetric.     Comments: 5/5 strength in entire lower extremities bilaterally. No sensation deficit.      ED Results / Procedures / Treatments   Labs (all labs ordered are listed, but only abnormal results are displayed) Labs Reviewed - No data to display  EKG None  Radiology No results found.  Procedures Procedures (including critical care time)  Medications Ordered in ED Medications  ketorolac (TORADOL) 15 MG/ML injection 30 mg (30 mg Intramuscular Given 07/07/20 2047)  HYDROcodone-acetaminophen (NORCO/VICODIN) 5-325 MG per tablet 1 tablet (1 tablet Oral Given 07/07/20 2047)    ED Course  I have reviewed the triage vital signs and the nursing notes.  Pertinent labs & imaging  results that were available during my care of the patient were reviewed by me and considered in my medical decision making (see chart for details).  Patient seen and examined. Medications ordered.   Vital signs reviewed and are as follows: BP (!) 141/74 (BP Location: Right Arm)   Pulse 89   Temp 97.9 F (36.6 C) (Oral)   Resp 18   Ht 5\' 10"  (1.778 m)   Wt 72.6 kg   SpO2 97%   BMI 22.96 kg/m   In regards to  left lower groin pain, I do suspect that the patient may have a developing inguinal hernia.  Counseled on use of hernia belt.  Discussed that he will need general surgery follow-up.  Encouraged rest.  Work note given.  Discussed signs and symptoms of incarcerated or strangulated hernia and need to return if he develops severe pain, vomiting, redness or warmth over the area of pain.  He verbalizes understanding agrees with plan.  In regards to the back pain, pain is radicular nature.  He has a history of bulging disks.  I do not feel the patient requires emergent MRI.  No red flags of back pain.  Patient given a short course of Vicodin, number 8 tablets, Robaxin, naproxen.  He will be given IM Toradol here as well as 1 oral Vicodin.  No red flag s/s of low back pain. Patient was counseled on back pain precautions and told to do activity as tolerated but do not lift, push, or pull heavy objects more than 10 pounds for the next week.  Patient counseled to use ice or heat on back for no longer than 15 minutes every hour.   Patient counseled on proper use of muscle relaxant medication.  They were told not to drink alcohol, drive any vehicle, or do any dangerous activities while taking this medication.  Patient verbalized understanding.  Patient urged to follow-up with PCP if pain does not improve with treatment and rest or if pain becomes recurrent. Urged to return with worsening severe pain, loss of bowel or bladder control, trouble walking.   The patient verbalizes understanding and agrees  with the plan.     MDM Rules/Calculators/A&P                          Abdominal hernias: Patient has multiple concerning areas, these are recurrent.  No indications of incarceration or strangulation tonight.  Do not feel that patient requires CT imaging.  Treatment plan as above.  Low concern for diverticulitis, kidney stone, other intra-abdominal pathology.  Back pain: Patient with back pain with radicular features. No neurological deficits. Patient is ambulatory. No warning symptoms of back pain including: fecal incontinence, urinary retention or overflow incontinence, night sweats, waking from sleep with back pain, unexplained fevers or weight loss, h/o cancer, IVDU, recent trauma. No concern for cauda equina, epidural abscess, or other serious cause of back pain. Conservative measures such as rest, ice/heat and pain medicine indicated with PCP follow-up if no improvement with conservative management.    Final Clinical Impression(s) / ED Diagnoses Final diagnoses:  Non-recurrent inguinal hernia without obstruction or gangrene, unspecified laterality  Lumbar radiculopathy    Rx / DC Orders ED Discharge Orders         Ordered    methocarbamol (ROBAXIN) 500 MG tablet  4 times daily     Discontinue  Reprint     07/07/20 2046    HYDROcodone-acetaminophen (NORCO/VICODIN) 5-325 MG tablet     Discontinue  Reprint     07/07/20 2046    naproxen (NAPROSYN) 500 MG tablet  2 times daily     Discontinue  Reprint     07/07/20 2046           Renne Crigler, PA-C 07/07/20 2056    Arby Barrette, MD 07/09/20 1359

## 2020-07-07 NOTE — ED Triage Notes (Addendum)
Abdominal pain with 3 hernias. Back pain. States he has a herniated disc. He is ambulatory. States he needs a work note.

## 2020-07-14 ENCOUNTER — Emergency Department (HOSPITAL_BASED_OUTPATIENT_CLINIC_OR_DEPARTMENT_OTHER)
Admission: EM | Admit: 2020-07-14 | Discharge: 2020-07-14 | Disposition: A | Discharge status: Home / Self Care | Payer: Medicaid Other

## 2020-07-14 ENCOUNTER — Other Ambulatory Visit: Payer: Self-pay

## 2020-07-24 ENCOUNTER — Other Ambulatory Visit: Payer: Self-pay

## 2020-07-24 ENCOUNTER — Encounter (HOSPITAL_BASED_OUTPATIENT_CLINIC_OR_DEPARTMENT_OTHER): Payer: Self-pay

## 2020-07-24 ENCOUNTER — Emergency Department (HOSPITAL_BASED_OUTPATIENT_CLINIC_OR_DEPARTMENT_OTHER)
Admission: EM | Admit: 2020-07-24 | Discharge: 2020-07-24 | Disposition: A | Discharge status: Home / Self Care | Payer: Medicaid Other | Attending: Emergency Medicine | Admitting: Emergency Medicine

## 2020-07-24 DIAGNOSIS — J45909 Unspecified asthma, uncomplicated: Secondary | ICD-10-CM | POA: Insufficient documentation

## 2020-07-24 DIAGNOSIS — M791 Myalgia, unspecified site: Secondary | ICD-10-CM | POA: Insufficient documentation

## 2020-07-24 DIAGNOSIS — R300 Dysuria: Secondary | ICD-10-CM | POA: Insufficient documentation

## 2020-07-24 DIAGNOSIS — M5431 Sciatica, right side: Secondary | ICD-10-CM

## 2020-07-24 DIAGNOSIS — R202 Paresthesia of skin: Secondary | ICD-10-CM | POA: Insufficient documentation

## 2020-07-24 DIAGNOSIS — F17210 Nicotine dependence, cigarettes, uncomplicated: Secondary | ICD-10-CM | POA: Insufficient documentation

## 2020-07-24 MED ORDER — METHYLPREDNISOLONE 4 MG PO TBPK: ORAL_TABLET | ORAL | 0 refills | Status: DC

## 2020-07-24 MED ORDER — HYDROCODONE-ACETAMINOPHEN 5-325 MG PO TABS
1.0000 | ORAL_TABLET | Freq: Once | ORAL | Status: AC
  Administered 2020-07-24: 1 via ORAL
  Filled 2020-07-24: qty 1

## 2020-07-24 NOTE — ED Triage Notes (Signed)
Pt c/o pain to right lower back that radiates down left LE x 2 weeks-denies injury-reports hx of "bulging disk"-pt walking with cane-NAD

## 2020-07-24 NOTE — Discharge Instructions (Signed)
Take the steroids as directed. Apply heat, stretch and massage the area. Follow-up with the specialist listed below. Return to the ER for worsening pain, injuries or falls, losing control of your bowels or bladder, fever, chest pain or shortness of breath.

## 2020-07-24 NOTE — ED Provider Notes (Signed)
[facility] EMERGENCY DEPARTMENT Provider Note   CSN: [id number] Arrival date & time: 07/24/20  1716     History Chief Complaint  Patient presents with   Back Pain    Charles Meyers is a 39 y.o. male who presents to ED with a chief complaint of right-sided back pain.  Has had intermittent pain for the past several months.  He noticed that 2 weeks ago had sharp shooting pain and paresthesias down his right leg.  States that this has persisted since.  He has tried over-the-counter medications and muscle relaxer with only minimal improvement in his symptoms.  Denies any injuries or falls.  Does report some dysuria but does not want to be tested for a UTI and is not concerned that he has a UTI.  Was told that he had DDD and bulging disc in his back.  He has not yet seen a specialist.  Denies any loss of bowel or bladder function, weakness, chest pain, fever, history of kidney stones.  He has had to ambulate with a walker secondary to pain.  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 Authorizing Provider  HYDROcodone-acetaminophen (NORCO/VICODIN) 5-325 MG tablet Take 1 tablet every 6 hours as needed for severe pain 07/07/20   Renne Crigler, PA-C  methocarbamol (ROBAXIN) 500 MG tablet Take 2 tablets (1,000 mg total) by mouth 4 (four) times daily. 07/07/20   Renne Crigler, PA-C  methylPREDNISolone (MEDROL DOSEPAK) 4 MG TBPK tablet Taper over 6 days. 07/24/20   Kaja Jackowski, PA-C  naproxen (NAPROSYN) 500 MG tablet Take 1 tablet (500 mg total) by mouth 2 (two) times daily. 07/07/20   Renne Crigler, PA-C    Allergies    Ciprofloxacin and Morphine and related  Review of Systems   Review of Systems  Constitutional: Negative for chills and fever.  Genitourinary: Positive for dysuria.  Musculoskeletal: Positive for back pain and myalgias.  Skin: Negative for wound.  Neurological: Negative for weakness and numbness.    Physical Exam Updated Vital Signs BP 138/89 (BP Location: Right Arm)    Pulse 95    Temp 98.1 F (36.7 C) (Oral)    Resp 16    SpO2 99%   Physical Exam Vitals and nursing note reviewed.  Constitutional:      General: He is not in acute distress.    Appearance: He is  well-developed.  HENT:     Head: Normocephalic and atraumatic.     Nose: Nose normal.  Eyes:     General: No scleral icterus.       Left eye: No discharge.     Conjunctiva/sclera: Conjunctivae normal.  Cardiovascular:     Rate and Rhythm: Normal rate and regular rhythm.     Heart sounds: Normal heart sounds. No murmur heard.  No friction rub. No gallop.   Pulmonary:     Effort: Pulmonary effort is normal. No respiratory distress.     Breath sounds: Normal breath sounds.  Abdominal:     General: Bowel sounds are normal. There is no distension.     Palpations: Abdomen is soft.     Tenderness: There is no abdominal tenderness. There is no guarding.  Musculoskeletal:        General: Normal range of motion.     Cervical back: Normal range of  motion and neck supple.     Lumbar back: Tenderness present.       Back:     Comments: Tenderness palpation of the right paraspinal musculature near the lumbar spine. No midline spinal tenderness present in lumbar, thoracic or cervical spine. No step-off palpated. No visible bruising, edema or temperature change noted. No objective signs of numbness present. No saddle anesthesia. 2+ DP pulses bilaterally. Sensation intact to light touch. Strength 5/5 in bilateral lower extremities.  Skin:    General: Skin is warm and dry.     Findings: No rash.  Neurological:     Mental Status: He is alert.     Motor: No abnormal muscle tone.     Coordination: Coordination normal.     ED Results / Procedures / Treatments   Labs (all labs ordered are listed, but only abnormal results are displayed) Labs Reviewed - No data to display  EKG None  Radiology No results found.  Procedures Procedures (including critical care time)  Medications Ordered in ED Medications  HYDROcodone-acetaminophen (NORCO/VICODIN) 5-325 MG per tablet 1 tablet (1 tablet Oral Given 07/24/20 1923)    ED Course  I have reviewed the triage vital signs and the nursing notes.  Pertinent labs & imaging results that were available during my care of the patient were reviewed by me and considered in my medical decision making (see chart for details).    MDM Rules/Calculators/A&P                          Patient denies any concerning symptoms suggestive of cauda equina requiring urgent imaging at this time such as loss of sensation in the lower extremities, lower extremity weakness, loss of bowel or bladder control, saddle anesthesia, urinary retention, fever/chills, IVDU. Exam demonstrated no  weakness on exam today. No preceding injury or trauma to suggest acute fracture. Doubt pelvic or urinary pathology for patient's acute back pain, as patient denies does report dysuria but states that he does not have a UTI because he has  had several UTIs in the past and this does not feel similar.  Doubt AAA as cause of patient's back pain as patient lacks major risk factors, had no abdominal TTP, and has symmetric and intact distal pulses.  Suspect that his symptoms are due to his known DDD with sciatica.  Will treat with steroid Dosepak, spine specialist follow-up.  I have consulted social work to help with PCP and specialty assistance.  Patient given strict return precautions for any symptoms indicating  worsening neurologic function in the lower extremities.   Patient is hemodynamically stable, in NAD, and able to ambulate in the ED. Evaluation does not show pathology that would require ongoing emergent intervention or inpatient treatment. I explained the diagnosis to the patient. Pain has been managed and has no complaints prior to discharge. Patient is comfortable with above plan and is stable for discharge at this time. All questions were answered prior to disposition. Strict return precautions for returning to the ED were discussed. Encouraged follow up with PCP.   An After Visit Summary was printed and given to the patient.   Portions of this note were generated with Scientist, clinical (histocompatibility and immunogenetics). Dictation errors may occur despite best attempts at proofreading.  Final Clinical Impression(s) / ED Diagnoses Final diagnoses:  Sciatica of right side    Rx / DC Orders ED Discharge Orders         Ordered    methylPREDNISolone (MEDROL DOSEPAK) 4 MG TBPK tablet     Discontinue  Reprint     07/24/20 1916           Dietrich Pates, PA-C 07/24/20 2228    Charlynne Pander, MD 07/24/20 [phone]

## 2020-07-24 NOTE — ED Notes (Signed)
ED Provider at bedside. 

## 2020-12-24 ENCOUNTER — Encounter (HOSPITAL_BASED_OUTPATIENT_CLINIC_OR_DEPARTMENT_OTHER): Payer: Self-pay | Admitting: *Deleted

## 2020-12-24 ENCOUNTER — Other Ambulatory Visit: Payer: Self-pay

## 2020-12-24 ENCOUNTER — Emergency Department (HOSPITAL_BASED_OUTPATIENT_CLINIC_OR_DEPARTMENT_OTHER)
Admission: EM | Admit: 2020-12-24 | Discharge: 2020-12-25 | Disposition: A | Discharge status: Home / Self Care | Payer: HRSA Program | Attending: Emergency Medicine | Admitting: Emergency Medicine

## 2020-12-24 ENCOUNTER — Emergency Department (HOSPITAL_BASED_OUTPATIENT_CLINIC_OR_DEPARTMENT_OTHER): Payer: HRSA Program

## 2020-12-24 DIAGNOSIS — R197 Diarrhea, unspecified: Secondary | ICD-10-CM | POA: Diagnosis not present

## 2020-12-24 DIAGNOSIS — R112 Nausea with vomiting, unspecified: Secondary | ICD-10-CM | POA: Insufficient documentation

## 2020-12-24 DIAGNOSIS — Z20822 Contact with and (suspected) exposure to covid-19: Secondary | ICD-10-CM | POA: Diagnosis not present

## 2020-12-24 DIAGNOSIS — R5381 Other malaise: Secondary | ICD-10-CM | POA: Diagnosis not present

## 2020-12-24 DIAGNOSIS — R0602 Shortness of breath: Secondary | ICD-10-CM | POA: Diagnosis not present

## 2020-12-24 DIAGNOSIS — R531 Weakness: Secondary | ICD-10-CM | POA: Diagnosis not present

## 2020-12-24 DIAGNOSIS — J45909 Unspecified asthma, uncomplicated: Secondary | ICD-10-CM | POA: Insufficient documentation

## 2020-12-24 DIAGNOSIS — R059 Cough, unspecified: Secondary | ICD-10-CM | POA: Diagnosis present

## 2020-12-24 DIAGNOSIS — R0789 Other chest pain: Secondary | ICD-10-CM | POA: Insufficient documentation

## 2020-12-24 DIAGNOSIS — F17210 Nicotine dependence, cigarettes, uncomplicated: Secondary | ICD-10-CM | POA: Insufficient documentation

## 2020-12-24 LAB — COMPREHENSIVE METABOLIC PANEL
ALT: 15 U/L (ref 0–44)
AST: 21 U/L (ref 15–41)
Albumin: 4.6 g/dL (ref 3.5–5.0)
Alkaline Phosphatase: 94 U/L (ref 38–126)
Anion gap: 12 (ref 5–15)
BUN: 20 mg/dL (ref 6–20)
CO2: 23 mmol/L (ref 22–32)
Calcium: 9.6 mg/dL (ref 8.9–10.3)
Chloride: 100 mmol/L (ref 98–111)
Creatinine, Ser: 1.02 mg/dL (ref 0.61–1.24)
GFR, Estimated: >60 mL/min (ref >60)
Glucose, Bld: 148 mg/dL — ABNORMAL HIGH (ref 70–99)
Potassium: 4 mmol/L (ref 3.5–5.1)
Sodium: 135 mmol/L (ref 135–145)
Total Bilirubin: 1 mg/dL (ref 0.3–1.2)
Total Protein: 7.7 g/dL (ref 6.5–8.1)

## 2020-12-24 LAB — CBC WITH DIFFERENTIAL/PLATELET
Abs Immature Granulocytes: 0.03 10*3/uL (ref 0.00–0.07)
Basophils Absolute: 0 10*3/uL (ref 0.0–0.1)
Basophils Relative: 0 %
Eosinophils Absolute: 0.1 10*3/uL (ref 0.0–0.5)
Eosinophils Relative: 1 %
HCT: 44.6 % (ref 39.0–52.0)
Hemoglobin: 16 g/dL (ref 13.0–17.0)
Immature Granulocytes: 0 %
Lymphocytes Relative: 23 %
Lymphs Abs: 2.3 10*3/uL (ref 0.7–4.0)
MCH: 34.1 pg — ABNORMAL HIGH (ref 26.0–34.0)
MCHC: 35.9 g/dL (ref 30.0–36.0)
MCV: 95.1 fL (ref 80.0–100.0)
Monocytes Absolute: 0.9 10*3/uL (ref 0.1–1.0)
Monocytes Relative: 9 %
Neutro Abs: 6.7 10*3/uL (ref 1.7–7.7)
Neutrophils Relative %: 67 %
Platelets: 406 10*3/uL — ABNORMAL HIGH (ref 150–400)
RBC: 4.69 MIL/uL (ref 4.22–5.81)
RDW: 12.7 % (ref 11.5–15.5)
WBC: 10 10*3/uL (ref 4.0–10.5)
nRBC: 0 % (ref 0.0–0.2)

## 2020-12-24 MED ORDER — ONDANSETRON HCL 4 MG/2ML IJ SOLN
4.0000 mg | Freq: Once | INTRAMUSCULAR | Status: AC
  Administered 2020-12-25: 4 mg via INTRAVENOUS
  Filled 2020-12-24: qty 2

## 2020-12-24 MED ORDER — KETOROLAC TROMETHAMINE 15 MG/ML IJ SOLN
15.0000 mg | Freq: Once | INTRAMUSCULAR | Status: AC
  Administered 2020-12-25: 15 mg via INTRAVENOUS
  Filled 2020-12-24: qty 1

## 2020-12-24 MED ORDER — SODIUM CHLORIDE 0.9 % IV BOLUS
1000.0000 mL | Freq: Once | INTRAVENOUS | Status: AC
  Administered 2020-12-25: 1000 mL via INTRAVENOUS

## 2020-12-24 NOTE — ED Provider Notes (Signed)
[facility] Provider Note: Lowella Dell, MD, FACEP  CSN: [id number] MRN: [id number] ARRIVAL: 12/24/20 at 1855 ROOM: MH12/[facility]12   CHIEF COMPLAINT  Covid Exposure   HISTORY OF PRESENT ILLNESS  12/24/20 11:46 PM Charles Meyers is a 40 y.o. male who states he has been sick since Christmas eve.  Specifically he has had cough, shortness of breath, wheezing, nausea, vomiting, diarrhea, chest wall pain (well localized, to the left of the sternum) and generalized weakness and malaise.  He rates his pain as a 5 out of 10, worse with palpation or movement.  He also thinks he may have a history of sleep apnea and has been gagging in his sleep and also while lying down trying to go to sleep.  He denies sore throat   Past Medical History:  Diagnosis Date  . Anxiety   . Asthma   . Urethral stricture in male     Past Surgical History:  Procedure Laterality Date  . URETHRAL DILATION      Family History  Problem Relation Age of Onset  . Cancer Mother   . Heart attack Father     Social History   Tobacco Use  . Smoking status: Current Every Day Smoker    Packs/day: 0.50    Types: Cigarettes  . Smokeless tobacco: Never Used  Vaping Use  . Vaping Use: Never used  Substance Use Topics  . Alcohol use: Yes    Comment: occ  . Drug use: Yes    Types: Marijuana    Prior to Admission medications   Medication Sig Start Date End Date Taking? Authorizing Provider  metoCLOPramide (REGLAN) 10 MG tablet Take 1 tablet (10 mg total) by mouth every 6 (six) hours as needed for nausea or vomiting. 12/25/20  Yes Tryson Lumley, MD  naproxen (NAPROSYN) 500 MG tablet Take 1 tablet twice daily as needed for pain. 12/25/20  Yes Milfred Krammes, MD    Allergies Ciprofloxacin and Morphine and related   REVIEW OF SYSTEMS  Negative except as noted here or in the History of Present Illness.   PHYSICAL EXAMINATION  Initial Vital Signs Blood pressure (!) 149/91, pulse 89, temperature 98.8 F  (37.1 C), temperature source Oral, resp. rate 16, height 5\' 9"  (1.753 m), weight 76.2 kg, SpO2 99 %.  Examination General: Well-developed, well-nourished male in no acute distress; appearance consistent with age of record HENT: normocephalic; atraumatic; pharynx normal Eyes: pupils equal, round and reactive to light; extraocular muscles intact Neck: supple Heart: regular rate and rhythm Lungs: clear to auscultation bilaterally; stridor or pseudo stridor with deep inspirations and expiration Abdomen: soft; nondistended; nontender; bowel sounds present Extremities: No deformity; full range of motion; pulses normal Neurologic: Awake, alert and oriented; motor function intact in all extremities and symmetric; no facial droop Skin: Warm and dry Psychiatric: Flat affect   RESULTS  Summary of this visit's results, reviewed and interpreted by myself:   EKG Interpretation  Date/Time:    Ventricular Rate:    PR Interval:    QRS Duration:   QT Interval:    QTC Calculation:   R Axis:     Text Interpretation:        Laboratory Studies: Results for orders placed or performed during the hospital encounter of 12/24/20 (from the past 24 hour(s))  CBC with Differential     Status: Abnormal   Collection Time: 12/24/20  7:52 PM  Result Value Ref Range   WBC 10.0 4.0 - 10.5 K/uL   RBC  4.69 4.22 - 5.81 MIL/uL   Hemoglobin 16.0 13.0 - 17.0 g/dL   HCT 93.8 18.2 - 99.3 %   MCV 95.1 80.0 - 100.0 fL   MCH 34.1 (H) 26.0 - 34.0 pg   MCHC 35.9 30.0 - 36.0 g/dL   RDW 71.6 96.7 - 89.3 %   Platelets 406 (H) 150 - 400 K/uL   nRBC 0.0 0.0 - 0.2 %   Neutrophils Relative % 67 %   Neutro Abs 6.7 1.7 - 7.7 K/uL   Lymphocytes Relative 23 %   Lymphs Abs 2.3 0.7 - 4.0 K/uL   Monocytes Relative 9 %   Monocytes Absolute 0.9 0.1 - 1.0 K/uL   Eosinophils Relative 1 %   Eosinophils Absolute 0.1 0.0 - 0.5 K/uL   Basophils Relative 0 %   Basophils Absolute 0.0 0.0 - 0.1 K/uL   Immature Granulocytes 0 %    Abs Immature Granulocytes 0.03 0.00 - 0.07 K/uL  Comprehensive metabolic panel     Status: Abnormal   Collection Time: 12/24/20  7:52 PM  Result Value Ref Range   Sodium 135 135 - 145 mmol/L   Potassium 4.0 3.5 - 5.1 mmol/L   Chloride 100 98 - 111 mmol/L   CO2 23 22 - 32 mmol/L   Glucose, Bld 148 (H) 70 - 99 mg/dL   BUN 20 6 - 20 mg/dL   Creatinine, Ser 8.10 0.61 - 1.24 mg/dL   Calcium 9.6 8.9 - 17.5 mg/dL   Total Protein 7.7 6.5 - 8.1 g/dL   Albumin 4.6 3.5 - 5.0 g/dL   AST 21 15 - 41 U/L   ALT 15 0 - 44 U/L   Alkaline Phosphatase 94 38 - 126 U/L   Total Bilirubin 1.0 0.3 - 1.2 mg/dL   GFR, Estimated >10 >25 mL/min   Anion gap 12 5 - 15   Imaging Studies: DG Neck Soft Tissue  Result Date: 12/25/2020 CLINICAL DATA:  Gagging EXAM: NECK SOFT TISSUES - 1+ VIEW COMPARISON:  None. FINDINGS: There is no evidence of retropharyngeal soft tissue swelling or epiglottic enlargement. The cervical airway is unremarkable and no radio-opaque foreign body identified. IMPRESSION: Negative. Electronically Signed   By: Charlett Nose M.D.   On: 12/25/2020 00:29   DG Chest 2 View  Result Date: 12/25/2020 CLINICAL DATA:  Gagging EXAM: CHEST - 2 VIEW COMPARISON:  12/24/2020 FINDINGS: The heart size and mediastinal contours are within normal limits. Both lungs are clear. The visualized skeletal structures are unremarkable. IMPRESSION: Normal study. Electronically Signed   By: Charlett Nose M.D.   On: 12/25/2020 00:30   DG Chest Portable 1 View  Result Date: 12/24/2020 CLINICAL DATA:  Cough EXAM: PORTABLE CHEST 1 VIEW COMPARISON:  01/20/2019 FINDINGS: The heart size and mediastinal contours are within normal limits. Both lungs are clear. The visualized skeletal structures are unremarkable. IMPRESSION: Negative Electronically Signed   By: Charlett Nose M.D.   On: 12/24/2020 20:04    ED COURSE and MDM  Nursing notes, initial and subsequent vitals signs, including pulse oximetry, reviewed and interpreted by  myself.  Vitals:   12/24/20 2337 12/25/20 0037 12/25/20 0216 12/25/20 0407  BP: (!) 149/91 (!) 141/79 120/67 (!) 95/59  Pulse: 89 82 81 68  Resp: 16 16 16 16   Temp:  98.5 F (36.9 C)    TempSrc:  Oral    SpO2: 99% 99% 98% 99%  Weight:      Height:       Medications  sodium chloride 0.9 % bolus 1,000 mL (0 mLs Intravenous Stopped 12/25/20 0216)  ketorolac (TORADOL) 15 MG/ML injection 15 mg (15 mg Intravenous Given 12/25/20 0036)  ondansetron (ZOFRAN) injection 4 mg (4 mg Intravenous Given 12/25/20 0036)  dexamethasone (DECADRON) injection 10 mg (10 mg Intravenous Given 12/25/20 0215)  metoCLOPramide (REGLAN) injection 10 mg (10 mg Intravenous Given 12/25/20 0245)   2:33 AM Patient still complaining of nausea despite IV fluids and Zofran.  Will give Reglan and reassess.  4:37 AM Patient's nausea improved.  He has had no vomiting while in the ED.  He has been observed by myself and his nurse multiple times while sleeping.  He has never had any apneic episodes that we have witnessed.  His symptoms may be due to long COVID.  His vital signs and laboratory studies are within normal limits and I do not see any indication for admission or further work-up at this time.   PROCEDURES  Procedures   ED DIAGNOSES     ICD-10-CM   1. Suspected COVID-19 virus infection  Z20.822   2. Nausea and vomiting in adult  R11.2        Paula Libra, MD 12/25/20 [phone]

## 2020-12-24 NOTE — ED Triage Notes (Signed)
covid exposure with sx x 1 week , n/v/d.

## 2020-12-25 ENCOUNTER — Emergency Department (HOSPITAL_BASED_OUTPATIENT_CLINIC_OR_DEPARTMENT_OTHER): Payer: HRSA Program

## 2020-12-25 LAB — SARS CORONAVIRUS 2 (TAT 6-24 HRS): SARS Coronavirus 2: NEGATIVE

## 2020-12-25 MED ORDER — METOCLOPRAMIDE HCL 5 MG/ML IJ SOLN
10.0000 mg | Freq: Once | INTRAMUSCULAR | Status: AC
  Administered 2020-12-25: 10 mg via INTRAVENOUS
  Filled 2020-12-25: qty 2

## 2020-12-25 MED ORDER — NAPROXEN 500 MG PO TABS: ORAL_TABLET | ORAL | 0 refills | Status: DC

## 2020-12-25 MED ORDER — DEXAMETHASONE SODIUM PHOSPHATE 10 MG/ML IJ SOLN
10.0000 mg | Freq: Once | INTRAMUSCULAR | Status: AC
  Administered 2020-12-25: 10 mg via INTRAVENOUS
  Filled 2020-12-25: qty 1

## 2020-12-25 MED ORDER — METOCLOPRAMIDE HCL 10 MG PO TABS: 10.0000 mg | ORAL_TABLET | Freq: Four times a day (QID) | ORAL | 0 refills | Status: AC | PRN

## 2020-12-25 NOTE — ED Notes (Signed)
Patient verbalizes understanding of discharge instructions. Opportunity for questioning and answers were provided. Armband removed by staff, pt discharged from ED ambulatory to home.  

## 2020-12-25 NOTE — ED Notes (Signed)
Pt transported to XRAY °

## 2020-12-25 NOTE — ED Notes (Signed)
Patient Acuity change was a mistake. Acuity changed back to appropriate level.

## 2020-12-25 NOTE — ED Notes (Signed)
Pt presented with Apple Juice as PO Challenge. HOB raised and pt adjusted to better PO intake.

## 2021-10-19 ENCOUNTER — Encounter (HOSPITAL_BASED_OUTPATIENT_CLINIC_OR_DEPARTMENT_OTHER): Payer: Self-pay

## 2021-10-19 ENCOUNTER — Emergency Department (HOSPITAL_BASED_OUTPATIENT_CLINIC_OR_DEPARTMENT_OTHER)
Admission: EM | Admit: 2021-10-19 | Discharge: 2021-10-19 | Discharge status: Against Medical Advice | Payer: Medicaid Other | Attending: Emergency Medicine | Admitting: Emergency Medicine

## 2021-10-19 ENCOUNTER — Other Ambulatory Visit: Payer: Self-pay

## 2021-10-19 DIAGNOSIS — B349 Viral infection, unspecified: Secondary | ICD-10-CM | POA: Insufficient documentation

## 2021-10-19 DIAGNOSIS — F17210 Nicotine dependence, cigarettes, uncomplicated: Secondary | ICD-10-CM | POA: Insufficient documentation

## 2021-10-19 DIAGNOSIS — J45909 Unspecified asthma, uncomplicated: Secondary | ICD-10-CM | POA: Insufficient documentation

## 2021-10-19 NOTE — ED Provider Notes (Signed)
[facility] EMERGENCY DEPARTMENT Provider Note  CSN: [id number] Arrival date & time: 10/19/21 1929    History Chief Complaint  Patient presents with   Letter for School/Work    Charles Meyers is a 40 y.o. male reports he had some viral symptoms the last few days but is feeling better now and needs a note to return to work tomorrow. No acute complaints.   Past Medical History:  Diagnosis Date   Anxiety    Asthma    Urethral stricture in male     Past Surgical History:  Procedure Laterality Date   URETHRAL DILATION      Family History  Problem Relation Age of Onset   Cancer Mother    Heart attack Father     Social History   Tobacco Use   Smoking status: Every Day    Packs/day: 0.50    Types: Cigarettes   Smokeless tobacco: Never  Vaping Use   Vaping Use: Never used  Substance Use Topics   Alcohol use: Yes    Comment: occ   Drug use: Yes    Types: Marijuana     Home Medications Prior to Admission medications   Medication Sig Start Date End Date Taking? Authorizing Provider  metoCLOPramide (REGLAN) 10 MG tablet Take 1 tablet (10 mg total) by mouth every 6 (six) hours as needed for nausea or vomiting. 12/25/20   Molpus, John, MD  naproxen (NAPROSYN) 500 MG tablet Take 1 tablet twice daily as needed for pain. 12/25/20   Molpus, John, MD     Allergies    Ciprofloxacin and Morphine and related   Review of Systems   Review of Systems A comprehensive review of systems was completed and negative except as noted in HPI.    Physical Exam BP (!) 137/95 (BP Location: Left Arm)   Pulse 95   Temp 98.2 F (36.8 C) (Oral)   Resp 16   Ht 5\' 9"  (1.753 m)   Wt 71.2 kg   SpO2 100%   BMI 23.18 kg/m   Physical Exam Vitals and nursing note reviewed.  HENT:     Head: Normocephalic.     Nose: Nose normal.  Eyes:     Extraocular Movements: Extraocular movements intact.  Pulmonary:     Effort: Pulmonary effort is normal.  Musculoskeletal:         General: Normal range of motion.     Cervical back: Neck supple.  Skin:    Findings: No rash (on exposed skin).  Neurological:     Mental Status: He is alert and oriented to person, place, and time.  Psychiatric:        Mood and Affect: Mood normal.     ED Results / Procedures / Treatments   Labs (all labs ordered are listed, but only abnormal results are displayed) Labs Reviewed - No data to display  EKG None   Radiology No results found.  Procedures Procedures  Medications Ordered in the ED Medications - No data to display   MDM Rules/Calculators/A&P MDM   ED Course  I have reviewed the triage vital signs and the nursing notes.  Pertinent labs & imaging results that were available during my care of the patient were reviewed by me and considered in my medical decision making (see chart for details).     Final Clinical Impression(s) / ED Diagnoses Final diagnoses:  Viral syndrome    Rx / DC Orders ED Discharge Orders     None  Pollyann Savoy, MD 10/19/21 2300

## 2021-10-19 NOTE — ED Triage Notes (Signed)
Pt states "I was sick-I feel better now-I need a note for work"-NAD-steady gait

## 2021-10-19 NOTE — ED Notes (Signed)
Pt presents to lobby-states he has been waiting outside-undischarged

## 2022-02-05 ENCOUNTER — Emergency Department (HOSPITAL_BASED_OUTPATIENT_CLINIC_OR_DEPARTMENT_OTHER)
Admission: EM | Admit: 2022-02-05 | Discharge: 2022-02-05 | Disposition: A | Discharge status: Home / Self Care | Payer: Self-pay | Attending: Emergency Medicine | Admitting: Emergency Medicine

## 2022-02-05 ENCOUNTER — Encounter (HOSPITAL_BASED_OUTPATIENT_CLINIC_OR_DEPARTMENT_OTHER): Payer: Self-pay

## 2022-02-05 ENCOUNTER — Other Ambulatory Visit: Payer: Self-pay

## 2022-02-05 ENCOUNTER — Emergency Department (HOSPITAL_BASED_OUTPATIENT_CLINIC_OR_DEPARTMENT_OTHER): Payer: Self-pay

## 2022-02-05 DIAGNOSIS — R0789 Other chest pain: Secondary | ICD-10-CM | POA: Insufficient documentation

## 2022-02-05 DIAGNOSIS — M25511 Pain in right shoulder: Secondary | ICD-10-CM | POA: Insufficient documentation

## 2022-02-05 DIAGNOSIS — J45909 Unspecified asthma, uncomplicated: Secondary | ICD-10-CM | POA: Insufficient documentation

## 2022-02-05 MED ORDER — KETOROLAC TROMETHAMINE 30 MG/ML IJ SOLN
30.0000 mg | Freq: Once | INTRAMUSCULAR | Status: AC
  Administered 2022-02-05: 30 mg via INTRAMUSCULAR
  Filled 2022-02-05: qty 1

## 2022-02-05 MED ORDER — METHYLPREDNISOLONE 4 MG PO TBPK: ORAL_TABLET | Freq: Four times a day (QID) | ORAL | 0 refills | Status: AC

## 2022-02-05 MED ORDER — NAPROXEN 500 MG PO TABS: 500.0000 mg | ORAL_TABLET | Freq: Two times a day (BID) | ORAL | 0 refills | Status: DC

## 2022-02-05 NOTE — ED Notes (Signed)
Pt A&OX4 ambulatory at d/c with independent steady gait. Pt verbalized understanding of d/c instructions, prescriptions and follow up care. 

## 2022-02-05 NOTE — ED Notes (Signed)
Charles Meyers refused to have is blood collected.  He stated that he did not have chest pain from heart issues and the it was pain from his shoulder.  He states that he does not feel that he needs blood work, only x-rays.  MD and RN informed.

## 2022-02-05 NOTE — ED Triage Notes (Addendum)
Pt c/o pain to right chest, right UE, and right upper/mid back pain, intermittent SOB started yesterday-denies injury to area-NAD-steady gait-pt later added pain to right shoulder "feels like something is out of joint" and requests xray/denies injury-pt carrying back pack w/o difficulty

## 2022-02-05 NOTE — Discharge Instructions (Signed)
You were seen in the emergency department today for right shoulder pain.  While you are here we did an x-ray which showed that there were no acute fractures or dislocations.  You may have a muscle strain or injury to a ligament or tendon.  I prescribing you a steroid Dosepak as well as some anti-inflammatory medication called naproxen.  Please take this as prescribed.  Additionally I am giving you contact information for sports medicine physician here in [facility].  Please call if you are continuing to have pain.

## 2022-02-05 NOTE — ED Provider Notes (Signed)
[facility] EMERGENCY DEPARTMENT Provider Note   CSN: [id number] Arrival date & time: 02/05/22  1412     History  Chief Complaint  Patient presents with   Chest Pain    Charles Meyers is a 42 y.o. male. With past medical history of alcohol use disorder, asthma who presents to the emergency department with right shoulder pain.  Triage with note of chest pain; however, patient states that he has right scapular pain that began yesterday. He states that the pain has migrated from the right scapula to the right trapezius and has briefly had right sided chest pain. He describes it as achy, intermittent, and worse with movement. 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 He denies shortness of breath, palpitations, lower extremity swelling. Additionally denies swelling of joints, fever.    Chest Pain Associated symptoms: no cough, no diaphoresis, no fever, no nausea, no palpitations and no shortness of breath    HPI: A 41 year old patient presents for evaluation of chest pain. Initial onset of pain was approximately 1-3 hours ago. The patient's chest pain is not worse with exertion. The patient's chest pain is not middle- or left-sided, is not well-localized, is not described as heaviness/pressure/tightness, is not sharp and does not radiate to the arms/jaw/neck. The patient does not complain of nausea and denies diaphoresis. The patient has no history of stroke, has no history of peripheral artery disease, has not smoked in the past 90 days, denies any history of treated diabetes, has no relevant family history of coronary artery disease (first degree relative at less than age 46), is not hypertensive, has no history of hypercholesterolemia and does not have an elevated BMI (>=30).   Home Medications Prior to Admission medications   Medication Sig Start Date End Date Taking? Authorizing Provider  methylPREDNISolone (MEDROL DOSEPAK) 4 MG  TBPK tablet Take by mouth taper from 4 doses each day to 1 dose and stop for 6 days. Taper over 6 days 02/05/22 02/11/22 Yes Mickie Hillier, PA-C  naproxen (NAPROSYN) 500 MG tablet Take 1 tablet (500 mg total) by mouth 2 (two) times daily. 02/05/22  Yes Mickie Hillier, PA-C  metoCLOPramide (REGLAN) 10 MG tablet Take 1 tablet (10 mg total) by mouth every 6 (six) hours as needed for nausea or vomiting. 12/25/20   Molpus, John, MD      Allergies    Ciprofloxacin and Morphine and related    Review of Systems   Review of Systems  Constitutional:  Negative for diaphoresis and fever.  Respiratory:  Negative for cough and shortness of breath.   Cardiovascular:  Negative for chest pain, palpitations and leg swelling.  Gastrointestinal:  Negative for nausea.  Musculoskeletal:  Positive for arthralgias and myalgias. Negative for joint swelling, neck pain and neck stiffness.  All other systems reviewed and are negative.  Physical Exam Updated Vital Signs BP 140/87 (BP Location: Right Arm)    Pulse 76    Temp 98.3 F (36.8 C) (Oral)    Resp 18    Wt 73.9 kg    SpO2 100%    BMI 24.07 kg/m  Physical Exam Vitals and nursing note reviewed.  Constitutional:      General: He is not in acute distress.    Appearance: He is well-developed. He is not ill-appearing or toxic-appearing.  HENT:     Head: Normocephalic and atraumatic.  Eyes:     General: No scleral icterus.  Extraocular Movements: Extraocular movements intact.  Neck:     Vascular: No JVD.  Cardiovascular:     Rate and Rhythm: Normal rate and regular rhythm.     Pulses: Normal pulses.          Radial pulses are 2+ on the right side and 2+ on the left side.     Heart sounds: Normal heart sounds. No murmur heard. Pulmonary:     Effort: Pulmonary effort is normal. No tachypnea or respiratory distress.     Breath sounds: Normal breath sounds.  Chest:     Chest wall: Tenderness present.  Abdominal:     Palpations: Abdomen is soft.   Musculoskeletal:        General: Tenderness present. No deformity or signs of injury. Normal range of motion.       Arms:     Cervical back: Normal range of motion and neck supple. No rigidity or tenderness.     Right lower leg: No tenderness.     Left lower leg: No tenderness.     Comments: TTP of the right scapula, trap, and right shoulder. Somewhat decreased ROM to the right arm due to pain. Compartments soft. Pulse 2+. No redness, swelling or erythema.   Skin:    General: Skin is warm and dry.     Capillary Refill: Capillary refill takes less than 2 seconds.     Findings: No bruising, erythema or rash.  Neurological:     General: No focal deficit present.     Mental Status: He is alert and oriented to person, place, and time. Mental status is at baseline.  Psychiatric:        Mood and Affect: Mood normal.        Behavior: Behavior normal.        Thought Content: Thought content normal.        Judgment: Judgment normal.    ED Results / Procedures / Treatments   Labs (all labs ordered are listed, but only abnormal results are displayed) Labs Reviewed  TROPONIN I (HIGH SENSITIVITY)  TROPONIN I (HIGH SENSITIVITY)    EKG EKG Interpretation  Date/Time:  Friday February 05 2022 14:41:26 EST Ventricular Rate:  100 PR Interval:  134 QRS Duration: 80 QT Interval:  324 QTC Calculation: 417 R Axis:   -10 Text Interpretation: Normal sinus rhythm Normal ECG No previous ECGs available Confirmed by Lavenia Atlas [phone]) on 02/05/2022 5:39:19 PM  Radiology DG Chest 2 View  Result Date: 02/05/2022 CLINICAL DATA:  CP EXAM: CHEST - 2 VIEW COMPARISON:  12/25/2020. FINDINGS: No consolidation. No visible pleural effusions or pneumothorax. Cardiomediastinal silhouette is within normal limits. No evidence of acute osseous abnormality. IMPRESSION: No evidence of acute cardiopulmonary disease. Electronically Signed   By: Margaretha Sheffield M.D.   On: 02/05/2022 15:11   DG Shoulder  Right  Result Date: 02/05/2022 CLINICAL DATA:  Right shoulder pain since yesterday EXAM: RIGHT SHOULDER - 2+ VIEW COMPARISON:  Right shoulder pain FINDINGS: There is no acute fracture or dislocation. Glenohumeral and acromioclavicular alignment is normal. The joint spaces are preserved. The soft tissues are unremarkable. IMPRESSION: Normal shoulder radiographs. Electronically Signed   By: Valetta Mole M.D.   On: 02/05/2022 15:12    Procedures Procedures   Medications Ordered in ED Medications  ketorolac (TORADOL) 30 MG/ML injection 30 mg (30 mg Intramuscular Given 02/05/22 1729)    ED Course/ Medical Decision Making/ A&P   HEAR Score: 0  Medical Decision Making Amount and/or Complexity of Data Reviewed Labs: ordered. Radiology: ordered.  Risk Prescription drug management.  Patient presents to the ED with complaints of right shoulder pain. This involves an extensive number of treatment options, and is a complaint that carries with it a moderate risk of complications and morbidity.   Additional history obtained:  Additional history obtained from: none External records from outside source obtained and reviewed including: previous ED visits   EKG: EKG: normal EKG, normal sinus rhythm.   Imaging Studies ordered:  I ordered imaging studies which included x-ray.  I independently reviewed & interpreted imaging & am in agreement with radiology impression. Imaging shows: CXR negative  Xray right shoulder normal   Medications  I ordered medication including Toradol for pain Reevaluation of the patient after medication shows that patient improved  Tests Considered: Labs   ED Course: 41 year old male who presents to the emergency department with right shoulder pain and episode of right chest pain.  Patient refused labs. Educated on reasoning for labs but patient refuses. Clinically, does not appear to be in distress, and do not think that his chest pain is  cardiac related. Likely MSK.  CXR negative. Xray shoulder negative. EKG without ischemia or infarction.  Given toradol IM For relief of symptoms. He is in no acute distress. Shoulder without evidence of septic joint. Doubt gonacoccal arthropathy, gouty arthropathy, arthritis, ACS, pneumonia.   His symptoms are clinically suspicious for cervical radiculopathy. Will given medrol dose pack and encourage use of NSAIDs. Given strict return precautions for septic joint, worsening chest pain/SOB/ACS   After consideration of the diagnostic results and the patients response to treatment, I feel that the patent would benefit from discharge. The patient has been appropriately medically screened and/or stabilized in the ED. I have low suspicion for any other emergent medical condition which would require further screening, evaluation or treatment in the ED or require inpatient management. The patient is overall well appearing and non-toxic in appearance. They are hemodynamically stable at time of discharge.   Final Clinical Impression(s) / ED Diagnoses Final diagnoses:  Acute pain of right shoulder    Rx / DC Orders ED Discharge Orders          Ordered    methylPREDNISolone (MEDROL DOSEPAK) 4 MG TBPK tablet  (Dosepack) 4x daily tapering        02/05/22 1803    naproxen (NAPROSYN) 500 MG tablet  2 times daily        02/05/22 1803              Mickie Hillier, PA-C 02/06/22 2006    HortonAlvin Critchley, DO 02/08/22 1559

## 2023-09-04 ENCOUNTER — Other Ambulatory Visit: Payer: Self-pay

## 2023-09-04 ENCOUNTER — Emergency Department (HOSPITAL_BASED_OUTPATIENT_CLINIC_OR_DEPARTMENT_OTHER): Payer: MEDICAID

## 2023-09-04 ENCOUNTER — Encounter (HOSPITAL_BASED_OUTPATIENT_CLINIC_OR_DEPARTMENT_OTHER): Payer: Self-pay | Admitting: Emergency Medicine

## 2023-09-04 ENCOUNTER — Emergency Department (HOSPITAL_BASED_OUTPATIENT_CLINIC_OR_DEPARTMENT_OTHER)
Admission: EM | Admit: 2023-09-04 | Discharge: 2023-09-04 | Disposition: A | Discharge status: Home / Self Care | Payer: MEDICAID | Attending: Emergency Medicine | Admitting: Emergency Medicine

## 2023-09-04 DIAGNOSIS — M545 Low back pain, unspecified: Secondary | ICD-10-CM | POA: Diagnosis present

## 2023-09-04 MED ORDER — ACETAMINOPHEN 500 MG PO TABS
1000.0000 mg | ORAL_TABLET | Freq: Once | ORAL | Status: AC
  Administered 2023-09-04: 1000 mg via ORAL
  Filled 2023-09-04: qty 2

## 2023-09-04 MED ORDER — KETOROLAC TROMETHAMINE 15 MG/ML IJ SOLN
15.0000 mg | Freq: Once | INTRAMUSCULAR | Status: AC
  Administered 2023-09-04: 15 mg via INTRAMUSCULAR

## 2023-09-04 MED ORDER — KETOROLAC TROMETHAMINE 15 MG/ML IJ SOLN
15.0000 mg | Freq: Once | INTRAMUSCULAR | Status: DC
  Filled 2023-09-04: qty 1

## 2023-09-04 MED ORDER — OXYCODONE HCL 5 MG PO TABS: 5.0000 mg | ORAL_TABLET | Freq: Four times a day (QID) | ORAL | 0 refills | Status: DC | PRN

## 2023-09-04 MED ORDER — OXYCODONE HCL 5 MG PO TABS
10.0000 mg | ORAL_TABLET | Freq: Once | ORAL | Status: AC
  Administered 2023-09-04: 10 mg via ORAL
  Filled 2023-09-04: qty 2

## 2023-09-04 NOTE — Discharge Instructions (Addendum)
You were seen today for back pain. We did not identify any emergent cause for your symptoms. Your evaluation is most consistent with nonspecific strain or sprain.   Plan and next steps:   The following may be helpful in managing your symptoms:   Pain- Lidocaine Patches  Apply to affected area for up to 12 hours at a time.   Pain/Fever- Adult Tylenol dosing:  650 mg orally every 4 to 6 hours as needed, MAX: 3250 mg/24 hours   (Extra-strength) 1000 mg orally every 6 hours as needed; MAX: 3000 mg/24 hours   Do not use if you have liver disease. Read the label on the bottle.   Pain/Fever- Adult Ibuprofen Dosing  200 to 400 mg orally every 4 to 6 hours as needed; MAX 1200 mg/day; do not take longer than 10 days   Do not use if you have kidney disease. Read the label on the bottle   Findings:  You may see all of your lab and imaging results utilizing our online portal! Look in this document or ask a team member for your mychart* access information. The most notable results have additionally been verbally communicated with you and your bedside family.    Follow-up Plan:   Follow up with the patient's normal primary care provider for monitoring of this condition within 48 hours.   Signs/Symptoms that would warrant return to the ED:  Please return to the ED if you experience worsening of symptoms or any abrupt changes in your health. Standard of care precautions for your chief complaint have already been verbally communicated with you. Always be on alert for fevers, chills, shortness of breath, chest pains, or sudden changes that warrant immediate evaluation.    Thank you for allowing [place] to be a part of you and your families' care.   Glyn Ade MD

## 2023-09-04 NOTE — ED Provider Notes (Signed)
[place] [facility] Provider Note   CSN: [id number] Arrival date & time: 09/04/23  1828     History Chief Complaint  Patient presents with   Back Pain    HPI Charles Meyers is a 42 y.o. male presenting for acute on chronic back pain. Episode started because he lifted a washing machine off of his truck yesterday. History of severe spinal herniation from poor form with lifting in the past.  States that he is short so he has to use his back to lift heavy items and that he works in [employer]. Patient's recorded medical, surgical, social, medication list and allergies were reviewed in the Snapshot window as part of the initial history.   Review of Systems   Review of Systems  Constitutional:  Negative for chills and fever.  HENT:  Negative for ear pain and sore throat.   Eyes:  Negative for pain and visual disturbance.  Respiratory:  Negative for cough and shortness of breath.   Cardiovascular:  Negative for chest pain and palpitations.  Gastrointestinal:  Negative for abdominal pain and vomiting.  Genitourinary:  Negative for dysuria and hematuria.  Musculoskeletal:  Positive for back pain. Negative for arthralgias.  Skin:  Negative for color change and rash.  Neurological:  Negative for seizures and syncope.  All other systems reviewed and are negative.   Physical Exam Updated Vital Signs BP 119/68   Pulse (!) 101   Temp (!) 96.6 F (35.9 C)   Resp 20   Ht 5\' 9"  (1.753 m)   Wt 74.8 kg   SpO2 98%   BMI 24.37 kg/m  Physical Exam Vitals and nursing note reviewed.  Constitutional:      General: He is not in acute distress.    Appearance: He is well-developed.  HENT:     Head: Normocephalic and atraumatic.  Eyes:     Conjunctiva/sclera: Conjunctivae normal.  Cardiovascular:     Rate and Rhythm: Normal rate and regular rhythm.     Heart sounds: No murmur heard. Pulmonary:     Effort: Pulmonary effort is normal. No  respiratory distress.     Breath sounds: Normal breath sounds.  Abdominal:     Palpations: Abdomen is soft.     Tenderness: There is no abdominal tenderness.  Musculoskeletal:        General: No swelling.     Cervical back: Neck supple.  Skin:    General: Skin is warm and dry.     Capillary Refill: Capillary refill takes less than 2 seconds.  Neurological:     Mental Status: He is alert.  Psychiatric:        Mood and Affect: Mood normal.      ED Course/ Medical Decision Making/ A&P    Procedures Procedures   Medications Ordered in ED Medications  acetaminophen (TYLENOL) tablet 1,000 mg (has no administration in time range)  ketorolac (TORADOL) 15 MG/ML injection 15 mg (has no administration in time range)  oxyCODONE (Oxy IR/ROXICODONE) immediate release tablet 10 mg (has no administration in time range)   Medical Decision Making:   Charles Meyers is a 42 y.o. male who presented to the ED today with acute lower back pain over the past 48 hours, detailed above.    Additional history discussed with patient's family/caregivers.  Patient placed on continuous vitals and telemetry monitoring while in ED which was reviewed periodically.   On my initial exam, the pt was with an intact neurologic exam,  tolerating ambulation with an antalgic gait and p.o. intake without difficulty.  Patient had no abnormal DTRs, no midline spinal tenderness.  Patient endorsing complete sensation of the perineum.  Patient without episodes of fecal or urinary incontinence.  Patient has no focal neurologic deficits and reassuring vital signs at this time.  No obvious physical abnormality or injury on exam. Notably, patient denies recent trauma, is afebrile, and denies IVDU.   Reviewed and confirmed nursing documentation for past medical history, family history, social history.    Initial Assessment:   With the patient's presentation of acute back pain in the above setting, most likely diagnosis is  musculoskeletal strain. Other diagnoses were considered including (but not limited to) underlying fracture, epidural hematoma, cauda equina syndrome, spinal stenosis, spinal malignancy. These are considered less likely due to history of present illness and physical exam findings.   In particular, lack of fever, substantial history of IV drug use, or substantial neurologic abnormality is less consistent with epidural abscess versus discitis or other spinal infection. In particular,  Initial Plan:  Multimodal pain control described and patient informed on safe usage.  Screening evaluation including below radiographic evaluation reviewed and grossly unremarkable at this time. Patient stable for continued outpatient evaluation and management of their musculoskeletal pains.  Patient referred back to primary care provider for continued evaluation and management.   Initial Study Results:   Radiology  DG Lumbar Spine Complete  Final Result       Disposition:   Based on the above findings, I believe patient is stable for discharge.    Patient and family educated about specific return precautions for given chief complaint and symptoms.  Patient and family educated about follow-up with PCP.  Patient and family expressed understanding of return precautions and need for follow-up. Patient spoken to regarding all imaging and laboratory results and appropriate follow up for these results. All education provided in verbal and written form and time was allowed for answering of patient questions. Patient discharged.      Emergency Department Medication Summary:   Medications  acetaminophen (TYLENOL) tablet 1,000 mg (has no administration in time range)  ketorolac (TORADOL) 15 MG/ML injection 15 mg (has no administration in time range)  oxyCODONE (Oxy IR/ROXICODONE) immediate release tablet 10 mg (has no administration in time range)     Clinical Impression:  1. Acute low back pain, unspecified back  pain laterality, unspecified whether sciatica present      Data Unavailable   Final Clinical Impression(s) / ED Diagnoses Final diagnoses:  Acute low back pain, unspecified back pain laterality, unspecified whether sciatica present    Rx / DC Orders ED Discharge Orders          Ordered    oxyCODONE (ROXICODONE) 5 MG immediate release tablet  Every 6 hours PRN        09/04/23 2104              Glyn Ade, MD 09/04/23 2104

## 2023-09-04 NOTE — ED Triage Notes (Signed)
Pt c/o lower back pain after trying to load a washing machine onto truck by himself last night

## 2023-09-05 ENCOUNTER — Telehealth (HOSPITAL_BASED_OUTPATIENT_CLINIC_OR_DEPARTMENT_OTHER): Payer: Self-pay | Admitting: Emergency Medicine

## 2023-09-05 MED ORDER — OXYCODONE HCL 5 MG PO TABS
5.0000 mg | ORAL_TABLET | Freq: Four times a day (QID) | ORAL | 0 refills | Status: DC | PRN
Start: 2023-09-05 — End: 2024-05-24

## 2023-09-05 NOTE — Telephone Encounter (Signed)
Patient request sending prescription to [facility] at [place]

## 2024-05-23 ENCOUNTER — Other Ambulatory Visit: Payer: Self-pay

## 2024-05-23 ENCOUNTER — Encounter (HOSPITAL_BASED_OUTPATIENT_CLINIC_OR_DEPARTMENT_OTHER): Payer: Self-pay

## 2024-05-23 ENCOUNTER — Emergency Department (HOSPITAL_BASED_OUTPATIENT_CLINIC_OR_DEPARTMENT_OTHER)
Admission: EM | Admit: 2024-05-23 | Discharge: 2024-05-24 | Disposition: A | Discharge status: Home / Self Care | Payer: MEDICAID | Attending: Emergency Medicine | Admitting: Emergency Medicine

## 2024-05-23 DIAGNOSIS — M545 Low back pain, unspecified: Secondary | ICD-10-CM | POA: Diagnosis present

## 2024-05-23 DIAGNOSIS — F17200 Nicotine dependence, unspecified, uncomplicated: Secondary | ICD-10-CM | POA: Diagnosis not present

## 2024-05-23 NOTE — ED Triage Notes (Signed)
 Complaining of lower back pain that started last night. Took some aleve  and the pain got worse. Hurts to stand or walk. Has not lifted anything or turned wrong.

## 2024-05-24 MED ORDER — NAPROXEN 500 MG PO TABS: 500.0000 mg | ORAL_TABLET | Freq: Two times a day (BID) | ORAL | 0 refills | Status: AC

## 2024-05-24 MED ORDER — KETOROLAC TROMETHAMINE 60 MG/2ML IM SOLN
60.0000 mg | Freq: Once | INTRAMUSCULAR | Status: AC
Start: 2024-05-24 — End: 2024-05-24
  Administered 2024-05-24: 60 mg via INTRAMUSCULAR
  Filled 2024-05-24: qty 2

## 2024-05-24 MED ORDER — OXYCODONE HCL 5 MG PO TABS: 5.0000 mg | ORAL_TABLET | ORAL | 0 refills | Status: AC | PRN

## 2024-05-24 MED ORDER — OXYCODONE HCL 5 MG PO TABS
5.0000 mg | ORAL_TABLET | Freq: Once | ORAL | Status: AC
  Administered 2024-05-24: 5 mg via ORAL
  Filled 2024-05-24: qty 1

## 2024-05-24 NOTE — ED Provider Notes (Signed)
 [place] [facility] Provider Note   CSN: [id number] Arrival date & time: 05/23/24  2121     History  Chief Complaint  Patient presents with   Back Pain    Charles Meyers is a 43 y.o. male.  The history is provided by the patient.  Back Pain Charles Meyers is a 43 y.o. male who presents to the Emergency Department complaining of back pain.  He presents to the emergency department for evaluation of acute on chronic back pain.  He has chronic low back pain and has had recurrent issues with this.  Worst episode was when he was unloading a washer in September.  Last night he developed recurrent dull ache in his low back.  Pain persisted this morning and worsened throughout the day.  No recent injuries.  Pain is worse with movement.  No numbness, weakness, loss of bowel or bladder.  Pain is nonradiating.  No fevers, Donnell pain, dysuria.  He does use tobacco, occasional alcohol .  No drug use.  No history of bacteremia.       Home Medications Prior to Admission medications   Medication Sig Start Date End Date Taking? Authorizing Provider  naproxen  (NAPROSYN ) 500 MG tablet Take 1 tablet (500 mg total) by mouth 2 (two) times daily with a meal. 05/24/24  Yes Kelsey Patricia, MD  oxyCODONE  (ROXICODONE ) 5 MG immediate release tablet Take 1 tablet (5 mg total) by mouth every 4 (four) hours as needed for severe pain (pain score 7-10). 05/24/24  Yes Kelsey Patricia, MD  metoCLOPramide  (REGLAN ) 10 MG tablet Take 1 tablet (10 mg total) by mouth every 6 (six) hours as needed for nausea or vomiting. 12/25/20   Molpus, John, MD      Allergies    Ciprofloxacin and Morphine  and codeine    Review of Systems   Review of Systems  Musculoskeletal:  Positive for back pain.  All other systems reviewed and are negative.   Physical Exam Updated Vital Signs BP (!) 143/88 (BP Location: Left Arm)   Pulse 94   Temp 98.1 F (36.7 C) (Oral)   Resp 18   Ht 5' 9  (1.753 m)   Wt 74.4 kg   SpO2 100%   BMI 24.22 kg/m  Physical Exam Vitals and nursing note reviewed.  Constitutional:      Appearance: He is well-developed.  HENT:     Head: Normocephalic and atraumatic.   Cardiovascular:     Rate and Rhythm: Normal rate and regular rhythm.  Pulmonary:     Effort: Pulmonary effort is normal. No respiratory distress.  Abdominal:     Palpations: Abdomen is soft.     Tenderness: There is no abdominal tenderness. There is no guarding or rebound.   Musculoskeletal:        General: No swelling.     Comments: Ttp over midline lower back   Skin:    General: Skin is warm and dry.   Neurological:     Mental Status: He is alert and oriented to person, place, and time.     Comments: 5/5 strength in ble with sensation to light touch intact in BLE  Psychiatric:        Behavior: Behavior normal.     ED Results / Procedures / Treatments   Labs (all labs ordered are listed, but only abnormal results are displayed) Labs Reviewed - No data to display  EKG None  Radiology No results found.  Procedures Procedures  Medications Ordered in ED Medications  ketorolac  (TORADOL ) injection 60 mg (60 mg Intramuscular Given 05/24/24 0037)  oxyCODONE  (Oxy IR/ROXICODONE ) immediate release tablet 5 mg (5 mg Oral Given 05/24/24 0037)    ED Course/ Medical Decision Making/ A&P                                 Medical Decision Making Risk Prescription drug management.   Patient with history of recurrent back pain here for evaluation of acute exacerbation of his back pain with no clear triggers.  No red flags at this time.  He is neurologically intact on examination.  He did have plain films performed in September.  He has also had an MRI in 2021 on record review.  No indication for repeat imaging at this time.  Will treat his symptoms.  Discussed need for PCP/spine follow-up for further evaluation.  Return precautions discussed.        Final  Clinical Impression(s) / ED Diagnoses Final diagnoses:  Acute midline low back pain without sciatica    Rx / DC Orders ED Discharge Orders          Ordered    naproxen  (NAPROSYN ) 500 MG tablet  2 times daily with meals        05/24/24 0017    oxyCODONE  (ROXICODONE ) 5 MG immediate release tablet  Every 4 hours PRN        05/24/24 0017              Kelsey Patricia, MD 05/24/24 [phone]

## 2024-09-08 ENCOUNTER — Emergency Department (HOSPITAL_BASED_OUTPATIENT_CLINIC_OR_DEPARTMENT_OTHER): Payer: MEDICAID

## 2024-09-08 ENCOUNTER — Emergency Department (HOSPITAL_BASED_OUTPATIENT_CLINIC_OR_DEPARTMENT_OTHER)
Admission: EM | Admit: 2024-09-08 | Discharge: 2024-09-08 | Disposition: A | Discharge status: Home / Self Care | Payer: MEDICAID | Attending: Emergency Medicine | Admitting: Emergency Medicine

## 2024-09-08 ENCOUNTER — Other Ambulatory Visit: Payer: Self-pay

## 2024-09-08 ENCOUNTER — Encounter (HOSPITAL_BASED_OUTPATIENT_CLINIC_OR_DEPARTMENT_OTHER): Payer: Self-pay | Admitting: Emergency Medicine

## 2024-09-08 DIAGNOSIS — Z720 Tobacco use: Secondary | ICD-10-CM | POA: Insufficient documentation

## 2024-09-08 DIAGNOSIS — R1084 Generalized abdominal pain: Secondary | ICD-10-CM | POA: Diagnosis not present

## 2024-09-08 DIAGNOSIS — J45909 Unspecified asthma, uncomplicated: Secondary | ICD-10-CM | POA: Diagnosis not present

## 2024-09-08 DIAGNOSIS — J069 Acute upper respiratory infection, unspecified: Secondary | ICD-10-CM | POA: Insufficient documentation

## 2024-09-08 DIAGNOSIS — R059 Cough, unspecified: Secondary | ICD-10-CM | POA: Diagnosis present

## 2024-09-08 LAB — COMPREHENSIVE METABOLIC PANEL WITH GFR
ALT: 11 U/L (ref 0–44)
AST: 22 U/L (ref 15–41)
Albumin: 4.7 g/dL (ref 3.5–5.0)
Alkaline Phosphatase: 80 U/L (ref 38–126)
Anion gap: 12 (ref 5–15)
BUN: 9 mg/dL (ref 6–20)
CO2: 26 mmol/L (ref 22–32)
Calcium: 9.6 mg/dL (ref 8.9–10.3)
Chloride: 102 mmol/L (ref 98–111)
Creatinine, Ser: 1.2 mg/dL (ref 0.61–1.24)
GFR, Estimated: >60 mL/min (ref >60)
Glucose, Bld: 90 mg/dL (ref 70–99)
Potassium: 4.3 mmol/L (ref 3.5–5.1)
Sodium: 140 mmol/L (ref 135–145)
Total Bilirubin: 0.6 mg/dL (ref 0.0–1.2)
Total Protein: 7.1 g/dL (ref 6.5–8.1)

## 2024-09-08 LAB — RESP PANEL BY RT-PCR (RSV, FLU A&B, COVID)  RVPGX2
Influenza A by PCR: NEGATIVE
Influenza B by PCR: NEGATIVE
Resp Syncytial Virus by PCR: NEGATIVE
SARS Coronavirus 2 by RT PCR: NEGATIVE

## 2024-09-08 LAB — URINALYSIS, ROUTINE W REFLEX MICROSCOPIC
Bilirubin Urine: NEGATIVE
Glucose, UA: NEGATIVE mg/dL
Ketones, ur: NEGATIVE mg/dL
Nitrite: NEGATIVE
Protein, ur: NEGATIVE mg/dL
Specific Gravity, Urine: 1.01 (ref 1.005–1.030)
pH: 7 (ref 5.0–8.0)

## 2024-09-08 LAB — CBC WITH DIFFERENTIAL/PLATELET
Abs Immature Granulocytes: 0.02 K/uL (ref 0.00–0.07)
Basophils Absolute: 0 K/uL (ref 0.0–0.1)
Basophils Relative: 0 %
Eosinophils Absolute: 0 K/uL (ref 0.0–0.5)
Eosinophils Relative: 0 %
HCT: 44.2 % (ref 39.0–52.0)
Hemoglobin: 15.5 g/dL (ref 13.0–17.0)
Immature Granulocytes: 0 %
Lymphocytes Relative: 20 %
Lymphs Abs: 1.8 K/uL (ref 0.7–4.0)
MCH: 34 pg (ref 26.0–34.0)
MCHC: 35.1 g/dL (ref 30.0–36.0)
MCV: 96.9 fL (ref 80.0–100.0)
Monocytes Absolute: 1.3 K/uL — ABNORMAL HIGH (ref 0.1–1.0)
Monocytes Relative: 14 %
Neutro Abs: 6.1 K/uL (ref 1.7–7.7)
Neutrophils Relative %: 66 %
Platelets: 273 K/uL (ref 150–400)
RBC: 4.56 MIL/uL (ref 4.22–5.81)
RDW: 12.7 % (ref 11.5–15.5)
WBC: 9.3 K/uL (ref 4.0–10.5)
nRBC: 0 % (ref 0.0–0.2)

## 2024-09-08 LAB — URINALYSIS, MICROSCOPIC (REFLEX)

## 2024-09-08 LAB — GROUP A STREP BY PCR: Group A Strep by PCR: NOT DETECTED

## 2024-09-08 LAB — LIPASE, BLOOD: Lipase: 55 U/L — ABNORMAL HIGH (ref 11–51)

## 2024-09-08 MED ORDER — LACTATED RINGERS IV BOLUS
1000.0000 mL | Freq: Once | INTRAVENOUS | Status: AC
  Administered 2024-09-08: 1000 mL via INTRAVENOUS

## 2024-09-08 MED ORDER — METOCLOPRAMIDE HCL 5 MG/ML IJ SOLN
10.0000 mg | Freq: Once | INTRAMUSCULAR | Status: AC
  Administered 2024-09-08: 10 mg via INTRAVENOUS
  Filled 2024-09-08: qty 2

## 2024-09-08 MED ORDER — NAPROXEN 500 MG PO TABS: 500.0000 mg | ORAL_TABLET | Freq: Two times a day (BID) | ORAL | 0 refills | Status: AC

## 2024-09-08 MED ORDER — BENZONATATE 100 MG PO CAPS: 100.0000 mg | ORAL_CAPSULE | Freq: Three times a day (TID) | ORAL | 0 refills | Status: AC

## 2024-09-08 MED ORDER — FENTANYL CITRATE PF 50 MCG/ML IJ SOSY
50.0000 ug | PREFILLED_SYRINGE | Freq: Once | INTRAMUSCULAR | Status: AC
  Administered 2024-09-08: 50 ug via INTRAVENOUS
  Filled 2024-09-08: qty 1

## 2024-09-08 MED ORDER — IOHEXOL 300 MG/ML  SOLN
100.0000 mL | Freq: Once | INTRAMUSCULAR | Status: AC | PRN
Start: 2024-09-08 — End: 2024-09-08
  Administered 2024-09-08: 100 mL via INTRAVENOUS

## 2024-09-08 MED ORDER — METOCLOPRAMIDE HCL 10 MG PO TABS: 10.0000 mg | ORAL_TABLET | Freq: Four times a day (QID) | ORAL | 0 refills | Status: AC

## 2024-09-08 MED ORDER — DEXAMETHASONE SODIUM PHOSPHATE 10 MG/ML IJ SOLN
10.0000 mg | Freq: Once | INTRAMUSCULAR | Status: AC
Start: 2024-09-08 — End: 2024-09-08
  Administered 2024-09-08: 10 mg via INTRAVENOUS
  Filled 2024-09-08: qty 1

## 2024-09-08 MED ORDER — KETOROLAC TROMETHAMINE 15 MG/ML IJ SOLN
15.0000 mg | Freq: Once | INTRAMUSCULAR | Status: AC
  Administered 2024-09-08: 15 mg via INTRAVENOUS
  Filled 2024-09-08: qty 1

## 2024-09-08 NOTE — ED Triage Notes (Signed)
 Pt reports cough and fever x 2d

## 2024-09-08 NOTE — Discharge Instructions (Signed)
 You were seen today for upper respiratory like symptoms.  Provided you with some steroids today which should help out with your symptoms and headache over the course next 2 days.  Continue to take naproxen , cough medication and nausea medication as needed.  Please take Naprosyn , 500mg  by mouth twice daily as needed for pain - this in an antiinflammatory medicine (NSAID) and is similar to ibuprofen  - many people feel that it is stronger than ibuprofen  and it is easier to take since it is a smaller pill.  Please use this only for 1 week - if your pain persists, you will need to follow up with your doctor in the office for ongoing guidance and pain control.    Return to the ED for any new or worsening symptoms.

## 2024-09-08 NOTE — ED Provider Notes (Signed)
 [place] [facility] Provider Note   CSN: [id number] Arrival date & time: 09/08/24  2032     Patient presents with: Cough     Cough Associated symptoms: shortness of breath and sore throat   Patient is a 43 year old male presenting today for concerns for multiple complaints including nausea, vomiting, generalized abdominal pain, cough, odynophagia, congestion, chest pain, shortness of breath, fever that has been ongoing for the last 2 days.  Notes that he has headache, chest and neck pain secondary to the vomiting.  History of alcohol  use disorder, tobacco use disorder, anxiety, asthma.  Reports last use of alcohol  was over a week ago with last used THC earlier this past week.  Denies blurry vision, vertigo, dysuria, hematemesis, hemoptysis, numbness, tingling, lower extremity swelling.     Prior to Admission medications   Medication Sig Start Date End Date Taking? Authorizing Provider  benzonatate  (TESSALON ) 100 MG capsule Take 1 capsule (100 mg total) by mouth every 8 (eight) hours. 09/08/24  Yes Bre Pecina S, PA-C  metoCLOPramide  (REGLAN ) 10 MG tablet Take 1 tablet (10 mg total) by mouth every 6 (six) hours. 09/08/24  Yes Libero Puthoff S, PA-C  naproxen  (NAPROSYN ) 500 MG tablet Take 1 tablet (500 mg total) by mouth 2 (two) times daily. 09/08/24  Yes Shalana Jardin S, PA-C  metoCLOPramide  (REGLAN ) 10 MG tablet Take 1 tablet (10 mg total) by mouth every 6 (six) hours as needed for nausea or vomiting. 12/25/20   Molpus, Norleen, MD  naproxen  (NAPROSYN ) 500 MG tablet Take 1 tablet (500 mg total) by mouth 2 (two) times daily with a meal. 05/24/24   Griselda Norris, MD  oxyCODONE  (ROXICODONE ) 5 MG immediate release tablet Take 1 tablet (5 mg total) by mouth every 4 (four) hours as needed for severe pain (pain score 7-10). 05/24/24   Griselda Norris, MD    Allergies: Ciprofloxacin and Morphine  and codeine    Review of Systems  HENT:  Positive for  congestion and sore throat.   Respiratory:  Positive for cough and shortness of breath.   Gastrointestinal:  Positive for abdominal pain, nausea and vomiting.  All other systems reviewed and are negative.   Updated Vital Signs BP 106/64   Pulse 80   Temp 99.3 F (37.4 C) (Oral)   Resp 14   SpO2 99%   Physical Exam Vitals and nursing note reviewed.  Constitutional:      General: He is not in acute distress.    Appearance: Normal appearance. He is ill-appearing. He is not diaphoretic.  HENT:     Head: Normocephalic and atraumatic.     Nose: Congestion present.     Mouth/Throat:     Mouth: Mucous membranes are moist.     Pharynx: Oropharynx is clear. No oropharyngeal exudate or posterior oropharyngeal erythema.  Eyes:     General: No scleral icterus.       Right eye: No discharge.        Left eye: No discharge.     Extraocular Movements: Extraocular movements intact.     Conjunctiva/sclera: Conjunctivae normal.  Cardiovascular:     Rate and Rhythm: Normal rate and regular rhythm.     Pulses: Normal pulses.     Heart sounds: Normal heart sounds. No murmur heard.    No friction rub. No gallop.  Pulmonary:     Effort: Pulmonary effort is normal. No respiratory distress.     Breath sounds: No stridor. No wheezing, rhonchi or  rales.  Chest:     Chest wall: No tenderness.  Abdominal:     General: Abdomen is flat. There is no distension.     Palpations: Abdomen is soft.     Tenderness: There is abdominal tenderness (Generalized abdominal tenderness to palpation). There is no right CVA tenderness, left CVA tenderness, guarding or rebound.  Musculoskeletal:        General: No swelling, deformity or signs of injury.     Cervical back: Normal range of motion. No rigidity.     Right lower leg: No edema.     Left lower leg: No edema.  Skin:    General: Skin is warm and dry.     Findings: No bruising, erythema or lesion.  Neurological:     General: No focal deficit present.      Mental Status: He is alert and oriented to person, place, and time. Mental status is at baseline.     Sensory: No sensory deficit.     Motor: No weakness.  Psychiatric:        Mood and Affect: Mood normal.     (all labs ordered are listed, but only abnormal results are displayed) Labs Reviewed  CBC WITH DIFFERENTIAL/PLATELET - Abnormal; Notable for the following components:      Result Value   Monocytes Absolute 1.3 (*)    All other components within normal limits  LIPASE, BLOOD - Abnormal; Notable for the following components:   Lipase 55 (*)    All other components within normal limits  URINALYSIS, ROUTINE W REFLEX MICROSCOPIC - Abnormal; Notable for the following components:   Hgb urine dipstick TRACE (*)    Leukocytes,Ua SMALL (*)    All other components within normal limits  URINALYSIS, MICROSCOPIC (REFLEX) - Abnormal; Notable for the following components:   Bacteria, UA FEW (*)    All other components within normal limits  RESP PANEL BY RT-PCR (RSV, FLU A&B, COVID)  [id number]  GROUP A STREP BY PCR  COMPREHENSIVE METABOLIC PANEL WITH GFR    EKG: None  Radiology: CT ABDOMEN PELVIS W CONTRAST Result Date: 09/08/2024 CLINICAL DATA:  Cough and fever x2 days. EXAM: CT ABDOMEN AND PELVIS WITH CONTRAST TECHNIQUE: Multidetector CT imaging of the abdomen and pelvis was performed using the standard protocol following bolus administration of intravenous contrast. RADIATION DOSE REDUCTION: This exam was performed according to the departmental dose-optimization program which includes automated exposure control, adjustment of the mA and/or kV according to patient size and/or use of iterative reconstruction technique. CONTRAST:  OMNIPAQUE  IOHEXOL  300 MG/ML  SOLN COMPARISON:  Apr 21, 2020 FINDINGS: Lower chest: No acute abnormality. Hepatobiliary: No focal liver abnormality is seen. No gallstones, gallbladder wall thickening, or biliary dilatation. Pancreas: Unremarkable. No pancreatic  ductal dilatation or surrounding inflammatory changes. Spleen: Normal in size without focal abnormality. Adrenals/Urinary Tract: Adrenal glands are unremarkable. Kidneys are normal, without renal calculi, focal lesion, or hydronephrosis. There is mild diffuse urinary bladder wall thickening. Stomach/Bowel: Stomach is within normal limits. Appendix appears normal. No evidence of bowel wall thickening, distention, or inflammatory changes. Vascular/Lymphatic: No significant vascular findings are present. No enlarged abdominal or pelvic lymph nodes. Reproductive: The prostate gland is normal in size with stable mild to moderate severity parenchymal calcification. Other: There is a 2.2 cm x 2.0 cm fat containing right inguinal hernia. A 1.6 cm x 1.6 cm fat containing left inguinal hernia is also seen. A stable 1.3 cm x 1.6 cm fat containing umbilical hernia is present. No  abdominopelvic ascites. Musculoskeletal: No acute or significant osseous findings. IMPRESSION: 1. Mild diffuse urinary bladder wall thickening which may represent sequelae associated with cystitis. Correlation with urinalysis is recommended. 2. Small, stable fat containing umbilical and bilateral inguinal hernias. Electronically Signed   By: Suzen Dials M.D.   On: 09/08/2024 22:44   DG Chest 2 View Result Date: 09/08/2024 EXAM: 2 VIEW(S) XRAY OF THE CHEST 09/08/2024 10:12:00 PM COMPARISON: 02/05/22. CLINICAL HISTORY: shortness of breath, cough, fever, chest pain. Pt reports cough and fever x 2d FINDINGS: LUNGS AND PLEURA: No focal pulmonary opacity. No pulmonary edema. No pleural effusion. No pneumothorax. HEART AND MEDIASTINUM: No acute abnormality of the cardiac and mediastinal silhouettes. BONES AND SOFT TISSUES: No acute osseous abnormality. IMPRESSION: 1. No acute abnormalities. Electronically signed by: Norman Gatlin MD 09/08/2024 10:33 PM EDT RP Workstation: [id number]    Procedures   Medications Ordered in the ED  lactated  ringers bolus 1,000 mL (0 mLs Intravenous Stopped 09/08/24 2321)  fentaNYL  (SUBLIMAZE ) injection 50 mcg (50 mcg Intravenous Given 09/08/24 2123)  metoCLOPramide  (REGLAN ) injection 10 mg (10 mg Intravenous Given 09/08/24 2123)  iohexol  (OMNIPAQUE ) 300 MG/ML solution 100 mL (100 mLs Intravenous Contrast Given 09/08/24 2230)  ketorolac  (TORADOL ) 15 MG/ML injection 15 mg (15 mg Intravenous Given 09/08/24 2257)  dexamethasone  (DECADRON ) injection 10 mg (10 mg Intravenous Given 09/08/24 2258)     Medical Decision Making Amount and/or Complexity of Data Reviewed Labs: ordered. Radiology: ordered.  Risk Prescription drug management.   This patient is a 43 year old male who presents to the ED for concern of headache, cough, ingestion, abdominal pain, shortness of breath, complaining that the symptoms have been ongoing for the last 2 days. On physical exam, patient is afebrile, alert and orient x 4, speaking in full sentences, nontachypneic, nontachycardic.  Notably appears to be ill persistently vomiting.  Abdomen is generally tender.  Unremarkable otherwise.  Patient had been provided LR, fentanyl , Reglan , Toradol , Decadron  and on reevaluation, noted that he is symptoms are greatly improved.  Lab work was complete unremarkable for any acute illness.  Low suspicion for abdominal infection/PNA.  Will send home with cough medication, nausea medication, anti-inflammatory.  Patient vital signs have remained stable throughout the course of patient's time in the ED. Low suspicion for any other emergent pathology at this time. I believe this patient is safe to be discharged. Provided strict return to ER precautions. Patient expressed agreement and understanding of plan. All questions were answered.  Differential diagnoses prior to evaluation: The emergent differential diagnosis includes, but is not limited to,  CHF, pericardial effusion/tamponade, arrhythmias, ACS, COPD, asthma, bronchitis, pneumonia,  pneumothorax, PE, anemia, PUD, gastritis, pancreatitis, gastroparesis, malignancy, biliary disease, ACS, pericarditis, pneumonia, intestinal ischemia, esophageal rupture, hepatitis, Mesenteric ischemia, diverticulitis, nephrolithiasis, constipation, bowel obstruction, IBD, testicular torsion   . This is not an exhaustive differential.   Past Medical History / Co-morbidities / Social History: Asthma, urethral stricture, anxiety, alcohol  use disorder, tobacco use disorder  Additional history: Chart reviewed. Pertinent results include:   Last seen in the ED on 08/2023 for chronic back pain.   Lab Tests/Imaging studies: I personally interpreted labs/imaging and the pertinent results include:   CBC unremarkable CMP unremarkable Lipase mildly elevated 55 Group A strep negative Respiratory panel negative  Chest x-ray unremarkable CT scan shows possible cystitis but UA unremarkable  I agree with the radiologist interpretation.    Medications: I ordered medication including Decadron , LR, Toradol , fentanyl , Reglan .  I have reviewed the patients home medicines and  have made adjustments as needed.  Critical Interventions: None  Social Determinants of Health: None  Disposition: After consideration of the diagnostic results and the patients response to treatment, I feel that the patient would benefit from discharge and treatment as above.   emergency department workup does not suggest an emergent condition requiring admission or immediate intervention beyond what has been performed at this time. The plan is: Symptomatic management at home, turn to ED for new or worsening symptoms.. The patient is safe for discharge and has been instructed to return immediately for worsening symptoms, change in symptoms or any other concerns.  Final diagnoses:  Viral upper respiratory tract infection    ED Discharge Orders          Ordered    metoCLOPramide  (REGLAN ) 10 MG tablet  Every 6 hours         09/08/24 2345    naproxen  (NAPROSYN ) 500 MG tablet  2 times daily        09/08/24 2345    benzonatate  (TESSALON ) 100 MG capsule  Every 8 hours        09/08/24 2345               Beola Terrall RAMAN, PA-C 09/08/24 2347    Ruthe Cornet, DO 09/09/24 1515

## 2024-09-12 ENCOUNTER — Encounter (HOSPITAL_BASED_OUTPATIENT_CLINIC_OR_DEPARTMENT_OTHER): Payer: Self-pay | Admitting: Urology

## 2024-09-12 ENCOUNTER — Emergency Department (HOSPITAL_BASED_OUTPATIENT_CLINIC_OR_DEPARTMENT_OTHER): Payer: MEDICAID

## 2024-09-12 ENCOUNTER — Other Ambulatory Visit: Payer: Self-pay

## 2024-09-12 ENCOUNTER — Other Ambulatory Visit (HOSPITAL_BASED_OUTPATIENT_CLINIC_OR_DEPARTMENT_OTHER): Payer: Self-pay

## 2024-09-12 ENCOUNTER — Emergency Department (HOSPITAL_BASED_OUTPATIENT_CLINIC_OR_DEPARTMENT_OTHER)
Admission: EM | Admit: 2024-09-12 | Discharge: 2024-09-12 | Disposition: A | Discharge status: Home / Self Care | Payer: MEDICAID | Attending: Emergency Medicine | Admitting: Emergency Medicine

## 2024-09-12 DIAGNOSIS — J45909 Unspecified asthma, uncomplicated: Secondary | ICD-10-CM | POA: Diagnosis not present

## 2024-09-12 DIAGNOSIS — R059 Cough, unspecified: Secondary | ICD-10-CM | POA: Diagnosis present

## 2024-09-12 DIAGNOSIS — J069 Acute upper respiratory infection, unspecified: Secondary | ICD-10-CM | POA: Diagnosis not present

## 2024-09-12 DIAGNOSIS — F17200 Nicotine dependence, unspecified, uncomplicated: Secondary | ICD-10-CM | POA: Diagnosis not present

## 2024-09-12 MED ORDER — PREDNISONE 10 MG PO TABS
40.0000 mg | ORAL_TABLET | Freq: Every day | ORAL | 0 refills | Status: AC
  Filled 2024-09-12: qty 20, 5d supply, fill #0

## 2024-09-12 MED ORDER — PREDNISONE 50 MG PO TABS
60.0000 mg | ORAL_TABLET | Freq: Once | ORAL | Status: AC
  Administered 2024-09-12: 60 mg via ORAL
  Filled 2024-09-12: qty 1

## 2024-09-12 NOTE — Discharge Instructions (Addendum)
 Take the prednisone as directed.  Continue to take the Tessalon  Perles.  Can continue to take the Naprosyn  for any body aches or headache.  Chest x-ray without any acute findings.  No evidence of pneumonia.

## 2024-09-12 NOTE — ED Triage Notes (Signed)
 Pt states cough and fever x 5 days  Was seen on 9/27 for same and told to come back of he got worse  States productive cough now worse and headache

## 2024-09-12 NOTE — ED Provider Notes (Addendum)
 [place] [facility] Provider Note   CSN: [id number] Arrival date & time: 09/12/24  1441     Patient presents with: Cough   Charles Meyers is a 43 y.o. male.   Patient seen September 27.  Back today because he actually feels as if he is getting worse.  Productive cough now worse has headache some bodyaches and his chest wall is very sore from all the coughing.  His spouse also has similar type symptoms.  When he was seen on the 27th he did 2 view chest they did CT abdomen and pelvis because he also had some abdominal pain nausea vomiting.  That has improved.  Patient's workup was significant for negative COVID RSV and influenza.  Also negative for strep.  And a negative chest x-ray and CT abdomen pelvis other than some bilateral inguinal hernias and umbilical hernia without evidence of any incarceration.  Past medical history significant for urethral stricture asthma anxiety and patient is an everyday smoker.  Patient's vital signs here are very reassuring temps 98.2 pulse 73 respiration 16 blood pressure 121/79 oxygen sats are 97% on room air.       Prior to Admission medications   Medication Sig Start Date End Date Taking? Authorizing Provider  predniSONE (DELTASONE) 10 MG tablet Take 4 tablets (40 mg total) by mouth daily. 09/12/24  Yes Lillianna Sabel, MD  benzonatate  (TESSALON ) 100 MG capsule Take 1 capsule (100 mg total) by mouth every 8 (eight) hours. 09/08/24   Bauer, Collin S, PA-C  metoCLOPramide  (REGLAN ) 10 MG tablet Take 1 tablet (10 mg total) by mouth every 6 (six) hours as needed for nausea or vomiting. 12/25/20   Molpus, John, MD  metoCLOPramide  (REGLAN ) 10 MG tablet Take 1 tablet (10 mg total) by mouth every 6 (six) hours. 09/08/24   Bauer, Collin S, PA-C  naproxen  (NAPROSYN ) 500 MG tablet Take 1 tablet (500 mg total) by mouth 2 (two) times daily with a meal. 05/24/24   Griselda Norris, MD  naproxen  (NAPROSYN ) 500 MG tablet Take 1 tablet  (500 mg total) by mouth 2 (two) times daily. 09/08/24   Bauer, Collin S, PA-C  oxyCODONE  (ROXICODONE ) 5 MG immediate release tablet Take 1 tablet (5 mg total) by mouth every 4 (four) hours as needed for severe pain (pain score 7-10). 05/24/24   Griselda Norris, MD    Allergies: Ciprofloxacin and Morphine  and codeine    Review of Systems  Constitutional:  Negative for chills and fever.  HENT:  Positive for congestion. Negative for ear pain and sore throat.   Eyes:  Negative for pain and visual disturbance.  Respiratory:  Positive for cough, shortness of breath and wheezing.   Cardiovascular:  Negative for chest pain and palpitations.  Gastrointestinal:  Negative for abdominal pain and vomiting.  Genitourinary:  Negative for dysuria and hematuria.  Musculoskeletal:  Positive for myalgias. Negative for arthralgias and back pain.  Skin:  Negative for color change and rash.  Neurological:  Positive for headaches. Negative for seizures and syncope.  All other systems reviewed and are negative.   Updated Vital Signs BP 121/79 (BP Location: Left Arm)   Pulse 73   Temp 98.2 F (36.8 C) (Oral)   Resp 16   Ht 1.753 m (5' 9)   Wt 74.4 kg   SpO2 97%   BMI 24.22 kg/m   Physical Exam Vitals and nursing note reviewed.  Constitutional:      General: He is not in acute  distress.    Appearance: Normal appearance. He is well-developed.  HENT:     Head: Normocephalic and atraumatic.     Mouth/Throat:     Mouth: Mucous membranes are moist.  Eyes:     Extraocular Movements: Extraocular movements intact.     Conjunctiva/sclera: Conjunctivae normal.     Pupils: Pupils are equal, round, and reactive to light.  Cardiovascular:     Rate and Rhythm: Normal rate and regular rhythm.     Heart sounds: No murmur heard. Pulmonary:     Effort: Pulmonary effort is normal. No respiratory distress.     Breath sounds: No stridor. Rhonchi present. No wheezing or rales.  Abdominal:     Palpations: Abdomen  is soft.     Tenderness: There is no abdominal tenderness.  Musculoskeletal:        General: No swelling.     Cervical back: Neck supple.  Skin:    General: Skin is warm and dry.     Capillary Refill: Capillary refill takes less than 2 seconds.  Neurological:     General: No focal deficit present.     Mental Status: He is alert and oriented to person, place, and time.  Psychiatric:        Mood and Affect: Mood normal.     (all labs ordered are listed, but only abnormal results are displayed) Labs Reviewed - No data to display  EKG: None  Radiology: DG Chest 2 View Result Date: 09/12/2024 CLINICAL DATA:  Persistent worsening cough, chest wall pain EXAM: CHEST - 2 VIEW COMPARISON:  09/08/2024 FINDINGS: Frontal and lateral views of the chest demonstrate an unremarkable cardiac silhouette. No acute airspace disease, effusion, or pneumothorax. No acute bony abnormalities. IMPRESSION: 1. No acute intrathoracic process. Electronically Signed   By: Ozell Daring M.D.   On: 09/12/2024 16:45     Procedures   Medications Ordered in the ED  predniSONE (DELTASONE) tablet 60 mg (60 mg Oral Given 09/12/24 1553)                                    Medical Decision Making Amount and/or Complexity of Data Reviewed Radiology: ordered.  Risk Prescription drug management.   Patient nontoxic no acute distress.  Will repeat chest x-ray just to make sure there is no evidence of his pneumonia.  I will also start him on a course of steroids which may help with the cough.  He was already treated with Reglan , Naprosyn , and Tessalon  [place].  Chest x-ray two-view without any acute findings which is very reassuring.  Patient given prednisone here.  Will have him continue with prednisone at home.  Patient nontoxic no acute distress.   Final diagnoses:  Upper respiratory tract infection, unspecified type    ED Discharge Orders          Ordered    predniSONE (DELTASONE) 10 MG tablet  Daily         09/12/24 1634               Geraldene Hamilton, MD 09/12/24 1542    Tashay Bozich, MD 09/12/24 2255
